# Patient Record
Sex: Female | Born: 1960 | Hispanic: No | Marital: Married | Smoking: Former smoker
Health system: Southern US, Community
[De-identification: ages and names within clinical notes are randomized; demographics above are authoritative.]

## PROBLEM LIST (undated history)

## (undated) DIAGNOSIS — K219 Gastro-esophageal reflux disease without esophagitis: Secondary | ICD-10-CM

## (undated) DIAGNOSIS — K589 Irritable bowel syndrome without diarrhea: Secondary | ICD-10-CM

## (undated) DIAGNOSIS — E559 Vitamin D deficiency, unspecified: Secondary | ICD-10-CM

## (undated) DIAGNOSIS — E785 Hyperlipidemia, unspecified: Secondary | ICD-10-CM

## (undated) DIAGNOSIS — R519 Headache, unspecified: Secondary | ICD-10-CM

## (undated) DIAGNOSIS — M810 Age-related osteoporosis without current pathological fracture: Secondary | ICD-10-CM

## (undated) HISTORY — DX: Vitamin D deficiency, unspecified: E55.9

## (undated) HISTORY — PX: EYE SURGERY: SHX253

## (undated) HISTORY — DX: Hyperlipidemia, unspecified: E78.5

## (undated) HISTORY — DX: Age-related osteoporosis without current pathological fracture: M81.0

## (undated) HISTORY — DX: Irritable bowel syndrome, unspecified: K58.9

---

## 1997-10-27 ENCOUNTER — Emergency Department (HOSPITAL_COMMUNITY): Admission: EM | Admit: 1997-10-27 | Discharge: 1997-10-27 | Payer: Self-pay | Admitting: Emergency Medicine

## 2012-08-23 DIAGNOSIS — M129 Arthropathy, unspecified: Secondary | ICD-10-CM | POA: Insufficient documentation

## 2012-08-23 DIAGNOSIS — E785 Hyperlipidemia, unspecified: Secondary | ICD-10-CM | POA: Insufficient documentation

## 2012-08-23 DIAGNOSIS — IMO0002 Reserved for concepts with insufficient information to code with codable children: Secondary | ICD-10-CM | POA: Insufficient documentation

## 2012-08-23 DIAGNOSIS — K219 Gastro-esophageal reflux disease without esophagitis: Secondary | ICD-10-CM | POA: Insufficient documentation

## 2012-08-23 DIAGNOSIS — F341 Dysthymic disorder: Secondary | ICD-10-CM | POA: Insufficient documentation

## 2012-08-23 DIAGNOSIS — G4700 Insomnia, unspecified: Secondary | ICD-10-CM | POA: Insufficient documentation

## 2014-12-03 DIAGNOSIS — M810 Age-related osteoporosis without current pathological fracture: Secondary | ICD-10-CM | POA: Insufficient documentation

## 2014-12-10 DIAGNOSIS — F411 Generalized anxiety disorder: Secondary | ICD-10-CM | POA: Insufficient documentation

## 2016-08-06 DIAGNOSIS — K5000 Crohn's disease of small intestine without complications: Secondary | ICD-10-CM | POA: Insufficient documentation

## 2018-09-02 DIAGNOSIS — R7303 Prediabetes: Secondary | ICD-10-CM | POA: Insufficient documentation

## 2018-09-02 DIAGNOSIS — E781 Pure hyperglyceridemia: Secondary | ICD-10-CM | POA: Insufficient documentation

## 2019-10-23 DIAGNOSIS — Z1211 Encounter for screening for malignant neoplasm of colon: Secondary | ICD-10-CM | POA: Insufficient documentation

## 2020-04-20 ENCOUNTER — Ambulatory Visit: Payer: Self-pay | Admitting: Medical

## 2020-07-26 ENCOUNTER — Ambulatory Visit (INDEPENDENT_AMBULATORY_CARE_PROVIDER_SITE_OTHER): Payer: 59 | Admitting: Medical

## 2020-07-26 ENCOUNTER — Other Ambulatory Visit: Payer: Self-pay

## 2020-07-26 ENCOUNTER — Encounter: Payer: Self-pay | Admitting: Medical

## 2020-07-26 VITALS — BP 132/82 | HR 77 | Temp 98.0°F | Resp 18 | Ht 61.0 in | Wt 129.8 lb

## 2020-07-26 DIAGNOSIS — R1310 Dysphagia, unspecified: Secondary | ICD-10-CM

## 2020-07-26 DIAGNOSIS — E785 Hyperlipidemia, unspecified: Secondary | ICD-10-CM | POA: Diagnosis not present

## 2020-07-26 DIAGNOSIS — K219 Gastro-esophageal reflux disease without esophagitis: Secondary | ICD-10-CM | POA: Diagnosis not present

## 2020-07-26 DIAGNOSIS — K589 Irritable bowel syndrome without diarrhea: Secondary | ICD-10-CM

## 2020-07-26 DIAGNOSIS — E559 Vitamin D deficiency, unspecified: Secondary | ICD-10-CM

## 2020-07-26 DIAGNOSIS — N941 Unspecified dyspareunia: Secondary | ICD-10-CM

## 2020-07-26 DIAGNOSIS — M810 Age-related osteoporosis without current pathological fracture: Secondary | ICD-10-CM

## 2020-07-26 NOTE — Progress Notes (Signed)
Subjective:    Patient ID: Jacqueline Vasquez, female    DOB: 02/22/1961, 60 y.o.   MRN: [id number]  HPI  Pt in for first time.  Was with [place]. Pt works for [employer]. Pt does exercise 10 minutes on exercise bike. Non smoker, no alcohol use. Pt states moderate healthy diet.   Pt has history cholesterol, gerd, Ibs, low vit d and osteopenia.  See med list.  Last year had labs but none since.  Pt states has some difficlty swollowing solids and drinks for years. Hx of gerd for year. On ppi. Pt had egd and colonocopy n 10-2019.    Review of Systems  Constitutional: Negative for chills, fatigue and fever.  HENT: Negative for congestion and drooling.   Respiratory: Negative for cough, chest tightness, shortness of breath and wheezing.   Cardiovascular: Negative for chest pain and palpitations.  Gastrointestinal: Negative for abdominal pain.  Genitourinary: Negative for difficulty urinating, dysuria, frequency and genital sores.  Musculoskeletal: Negative for back pain and neck pain.  Skin: Negative for rash.  Neurological: Negative for dizziness, numbness and headaches.  Hematological: Negative for adenopathy. Does not bruise/bleed easily.  Psychiatric/Behavioral: Negative for behavioral problems, confusion and dysphoric mood. The patient is not nervous/anxious.     No past medical history on file.   Social History   Socioeconomic History  . Marital status: Married    Spouse name: Not on file  . Number of children: Not on file  . Years of education: Not on file  . Highest education level: Not on file  Occupational History  . Not on file  Tobacco Use  . Smoking status: Not on file  . Smokeless tobacco: Not on file  Substance and Sexual Activity  . Alcohol use: Not on file  . Drug use: Not on file  . Sexual activity: Not on file  Other Topics Concern  . Not on file  Social History Narrative  . Not on file   Social Determinants of Health   Financial  Resource Strain: Not on file  Food Insecurity: Not on file  Transportation Needs: Not on file  Physical Activity: Not on file  Stress: Not on file  Social Connections: Not on file  Intimate Partner Violence: Not on file     No family history on file.  Allergies  Allergen Reactions  . Ibuprofen Nausea And Vomiting and Other (See Comments)    GI upset GI upset     Current Outpatient Medications on File Prior to Visit  Medication Sig Dispense Refill  . Cholecalciferol 50 MCG (2000 UT) TABS Take by mouth.    . dicyclomine (BENTYL) 20 MG tablet Take by mouth.    Marland Kitchen omeprazole (PRILOSEC) 20 MG capsule Take by mouth.    . Ospemifene 60 MG TABS Take by mouth.    . risedronate (ACTONEL) 150 MG tablet TAKE 1 TABLET BY MOUTH EVERY 30 (THIRTY) DAYS.    Marland Kitchen rosuvastatin (CRESTOR) 20 MG tablet Take 1 tablet by mouth daily.     No current facility-administered medications on file prior to visit.    BP 132/82   Pulse 77   Temp 98 F (36.7 C)   Resp 18   Ht 5' 1"  (1.549 m)   Wt 129 lb 12.8 oz (58.9 kg)   SpO2 98%   BMI 24.53 kg/m       Objective:   Physical Exam  General Mental Status- Alert. General Appearance- Not in acute distress.   Skin  General: Color- Normal Color. Moisture- Normal Moisture.  Neck Carotid Arteries- Normal color. Moisture- Normal Moisture. No carotid bruits. No JVD.  Chest and Lung Exam Auscultation: Breath Sounds:-Normal.  Cardiovascular Auscultation:Rythm- Regular. Murmurs & Other Heart Sounds:Auscultation of the heart reveals- No Murmurs.  Abdomen Inspection:-Inspeection Normal. Palpation/Percussion:Note:No mass. Palpation and Percussion of the abdomen reveal- Non Tender, Non Distended + BS, no rebound or guarding.   Neurologic Cranial Nerve exam:- CN III-XII intact(No nystagmus), symmetric smile. Strength:- 5/5 equal and symmetric strength both upper and lower extremities.      Assessment & Plan:  For history of hyperlipidemia we will  get lipid panel and metabolic panel today.  Presently continue Crestor 20 mg and we will see if dose adjustments needed.  For history of osteoporosis, continue Actonel and plan on getting repeat DEXA scan upcoming year.  For history of GERD, continue healthy diet and omeprazole.  For history of IBS continue Bentyl.  For recent painful swallowing went ahead and refer you to GI MD with [facility].  They can give advice on reflux and IBS as well.  For vitamin D deficiency placed order for vitamin D level today.  For history of pain during sex refer to GYN at the med center.  Follow-up date to be determined after lab review.  Mackie Pai, PA-C

## 2020-07-26 NOTE — Patient Instructions (Addendum)
For history of hyperlipidemia we will get lipid panel and metabolic panel today.  Presently continue Crestor 20 mg and we will see if dose adjustments needed.  For history of osteoporosis, continue Actonel and plan on getting repeat DEXA scan upcoming year.  For history of GERD, continue healthy diet and omeprazole.  For history of IBS continue Bentyl.  For recent painful swallowing went ahead and refer you to GI MD with [facility].  They can give advice on reflux and IBS as well.  For vitamin D deficiency placed order for vitamin D level today.  For history of pain during sex refer to GYN at the med center.  Follow-up date to be determined after lab review.

## 2020-07-27 ENCOUNTER — Telehealth: Payer: Self-pay

## 2020-07-27 ENCOUNTER — Telehealth: Payer: Self-pay | Admitting: Medical

## 2020-07-27 DIAGNOSIS — E875 Hyperkalemia: Secondary | ICD-10-CM

## 2020-07-27 LAB — COMPREHENSIVE METABOLIC PANEL
ALT: 42 U/L — ABNORMAL HIGH (ref 0–35)
AST: 37 U/L (ref 0–37)
Albumin: 4.4 g/dL (ref 3.5–5.2)
Alkaline Phosphatase: 64 U/L (ref 39–117)
BUN: 13 mg/dL (ref 6–23)
CO2: 26 mEq/L (ref 19–32)
Calcium: 9.1 mg/dL (ref 8.4–10.5)
Chloride: 103 mEq/L (ref 96–112)
Creatinine, Ser: 0.51 mg/dL (ref 0.40–1.20)
GFR: 100.33 mL/min (ref >60.00)
Glucose, Bld: 110 mg/dL — ABNORMAL HIGH (ref 70–99)
Potassium: 6.6 mEq/L (ref 3.5–5.1)
Sodium: 135 mEq/L (ref 135–145)
Total Bilirubin: 0.3 mg/dL (ref 0.2–1.2)
Total Protein: 7.1 g/dL (ref 6.0–8.3)

## 2020-07-27 LAB — LIPID PANEL
Cholesterol: 138 mg/dL (ref 0–200)
HDL: 43 mg/dL (ref >39.00)
LDL Cholesterol: 64 mg/dL (ref 0–99)
NonHDL: 95.18
Total CHOL/HDL Ratio: 3
Triglycerides: 155 mg/dL — ABNORMAL HIGH (ref 0.0–149.0)
VLDL: 31 mg/dL (ref 0.0–40.0)

## 2020-07-27 MED ORDER — SODIUM POLYSTYRENE SULFONATE 15 GM/60ML PO SUSP: ORAL | 0 refills | Status: AC

## 2020-07-27 NOTE — Telephone Encounter (Signed)
Future CMP placed.

## 2020-07-27 NOTE — Telephone Encounter (Signed)
CRITICAL VALUE STICKER  CRITICAL VALUE: Potassium 6.6  RECEIVER (on-site recipient of call):Malori Myers  DATE & TIME NOTIFIED: 07/27/20 At 12:45  MESSENGER (representative from lab): Saa  MD NOTIFIED: Mackie Pai, PA-C  TIME OF NOTIFICATION:   RESPONSE:

## 2020-07-31 LAB — VITAMIN D 1,25 DIHYDROXY
Vitamin D 1, 25 (OH)2 Total: 60 pg/mL (ref 18–72)
Vitamin D2 1, 25 (OH)2: <8 pg/mL
Vitamin D3 1, 25 (OH)2: 60 pg/mL

## 2020-08-02 ENCOUNTER — Other Ambulatory Visit: Payer: Self-pay

## 2020-08-03 ENCOUNTER — Other Ambulatory Visit: Payer: Self-pay

## 2020-08-03 ENCOUNTER — Other Ambulatory Visit (INDEPENDENT_AMBULATORY_CARE_PROVIDER_SITE_OTHER): Payer: 59

## 2020-08-03 DIAGNOSIS — E875 Hyperkalemia: Secondary | ICD-10-CM

## 2020-08-03 LAB — COMPREHENSIVE METABOLIC PANEL
ALT: 38 U/L — ABNORMAL HIGH (ref 0–35)
AST: 29 U/L (ref 0–37)
Albumin: 4.7 g/dL (ref 3.5–5.2)
Alkaline Phosphatase: 64 U/L (ref 39–117)
BUN: 8 mg/dL (ref 6–23)
CO2: 27 mEq/L (ref 19–32)
Calcium: 9.5 mg/dL (ref 8.4–10.5)
Chloride: 103 mEq/L (ref 96–112)
Creatinine, Ser: 0.5 mg/dL (ref 0.40–1.20)
GFR: 100.8 mL/min (ref >60.00)
Glucose, Bld: 106 mg/dL — ABNORMAL HIGH (ref 70–99)
Potassium: 4.3 mEq/L (ref 3.5–5.1)
Sodium: 139 mEq/L (ref 135–145)
Total Bilirubin: 0.5 mg/dL (ref 0.2–1.2)
Total Protein: 7.4 g/dL (ref 6.0–8.3)

## 2020-09-01 ENCOUNTER — Encounter: Payer: 59 | Admitting: Obstetrics and Gynecology

## 2020-10-13 ENCOUNTER — Encounter: Payer: 59 | Admitting: Obstetrics and Gynecology

## 2020-11-10 ENCOUNTER — Encounter: Payer: 59 | Admitting: Obstetrics and Gynecology

## 2020-12-25 NOTE — Progress Notes (Signed)
GYNECOLOGY OFFICE VISIT NOTE  History:   Jacqueline Vasquez is a 60 y.o.  G1P1 here today for pain with intercourse. This is not a new issue - she has not been able to have intercourse for about 2 years due to the pain. Now she has discomfort at baseline feeling like her vagina is dry.   She has tried nothing yet.  Her husband is very kind she reports so she wants to be able to have intercourse for him. She has low drive but would still be interested if it didn't hurt.   She also notes some leakage of urine with cough.   She denies any abnormal vaginal discharge, bleeding, pelvic pain or other concerns.     Past Medical History:  Diagnosis Date   Hyperlipidemia    IBS (irritable bowel syndrome)    Osteoporosis    Vitamin D deficiency     Past Surgical History:  Procedure Laterality Date   CESAREAN SECTION     EYE SURGERY      The following portions of the patient's history were reviewed and updated as appropriate: allergies, current medications, past family history, past medical history, past social history, past surgical history and problem list.   Health Maintenance:  Normal pap and negative HRHPV on 02/2020.  Normal mammogram on 02/2020.   Review of Systems:  Pertinent items noted in HPI and remainder of comprehensive ROS otherwise negative.  Physical Exam:  BP 124/80   Pulse 70   Resp 16   Ht 5' (1.524 m)   Wt 131 lb (59.4 kg)   BMI 25.58 kg/m  CONSTITUTIONAL: Well-developed, well-nourished female in no acute distress.  HEENT:  Normocephalic, atraumatic. External right and left ear normal. No scleral icterus.  NECK: Normal range of motion, supple, no masses noted on observation SKIN: No rash noted. Not diaphoretic. No erythema. No pallor. MUSCULOSKELETAL: Normal range of motion. No edema noted. NEUROLOGIC: Alert and oriented to person, place, and time. Normal muscle tone coordination. No cranial nerve deficit noted. PSYCHIATRIC: Normal mood and affect. Normal behavior.  Normal judgment and thought content.  CARDIOVASCULAR: Normal heart rate noted RESPIRATORY: Effort and breath sounds normal, no problems with respiration noted ABDOMEN: No masses noted. No other overt distention noted.    PELVIC: Normal appearing external genitalia; normal urethral meatus; normal appearing vaginal mucosa although atrophic and cervix.  No abnormal discharge noted.  Normal uterine size, no other palpable masses, no uterine or adnexal tenderness. Uterus very well supported as well as vaginal tissue Performed in the presence of a chaperone  Labs and Imaging No results found for this or any previous visit (from the past 168 hour(s)). No results found.    Assessment and Plan:    1. Dyspareunia in female - Vaginal dryness most likely due to being postmenopausal - exam is c/w atrophy.  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  - Recommended OTC lubricants i.e. silicone or oil based as well as vaginal moisturizer i.e. Replens - Discussed different formulations of vaginal estrogen. Reviewed the risks and package insert for vaginal estrogen.  - She would like to try vaginal estrogen.  - Reviewed therapy course with her and spouse, by phone   Routine preventative health maintenance measures emphasized. Please refer to After Visit Summary for other counseling recommendations.   No follow-ups on file.    I spent 30 minutes dedicated to the care of this patient including pre-visit review of records, face to face time with the patient discussing  her conditions and treatments and post visit orders.    Radene Gunning, MD, [facility], [place]

## 2020-12-29 ENCOUNTER — Encounter: Payer: Self-pay | Admitting: Obstetrics and Gynecology

## 2020-12-29 ENCOUNTER — Other Ambulatory Visit: Payer: Self-pay

## 2020-12-29 ENCOUNTER — Ambulatory Visit (INDEPENDENT_AMBULATORY_CARE_PROVIDER_SITE_OTHER): Payer: 59 | Admitting: Obstetrics and Gynecology

## 2020-12-29 VITALS — BP 124/80 | HR 70 | Resp 16 | Ht 60.0 in | Wt 131.0 lb

## 2020-12-29 DIAGNOSIS — N941 Unspecified dyspareunia: Secondary | ICD-10-CM

## 2020-12-29 MED ORDER — ESTRADIOL 0.1 MG/GM VA CREA: TOPICAL_CREAM | VAGINAL | 12 refills | Status: DC

## 2021-01-02 ENCOUNTER — Other Ambulatory Visit: Payer: Self-pay

## 2021-01-02 DIAGNOSIS — N941 Unspecified dyspareunia: Secondary | ICD-10-CM

## 2021-01-02 MED ORDER — ESTRADIOL 0.1 MG/GM VA CREA: TOPICAL_CREAM | VAGINAL | 12 refills | Status: DC

## 2021-01-02 MED ORDER — ESTRADIOL 0.1 MG/GM VA CREA: TOPICAL_CREAM | VAGINAL | 12 refills | Status: AC

## 2021-01-02 NOTE — Addendum Note (Signed)
Addended by: Radene Gunning A on: 01/02/2021 05:35 PM   Modules accepted: Orders

## 2021-02-21 ENCOUNTER — Encounter: Payer: 59 | Admitting: Medical

## 2021-03-10 ENCOUNTER — Ambulatory Visit (INDEPENDENT_AMBULATORY_CARE_PROVIDER_SITE_OTHER): Payer: 59 | Admitting: Medical

## 2021-03-10 VITALS — BP 127/81 | HR 83 | Temp 98.2°F | Resp 18 | Ht 60.0 in | Wt 133.6 lb

## 2021-03-10 DIAGNOSIS — Z0000 Encounter for general adult medical examination without abnormal findings: Secondary | ICD-10-CM | POA: Diagnosis not present

## 2021-03-10 DIAGNOSIS — E559 Vitamin D deficiency, unspecified: Secondary | ICD-10-CM | POA: Diagnosis not present

## 2021-03-10 DIAGNOSIS — E785 Hyperlipidemia, unspecified: Secondary | ICD-10-CM | POA: Diagnosis not present

## 2021-03-10 DIAGNOSIS — Z23 Encounter for immunization: Secondary | ICD-10-CM | POA: Diagnosis not present

## 2021-03-10 DIAGNOSIS — M810 Age-related osteoporosis without current pathological fracture: Secondary | ICD-10-CM | POA: Diagnosis not present

## 2021-03-10 DIAGNOSIS — K219 Gastro-esophageal reflux disease without esophagitis: Secondary | ICD-10-CM | POA: Diagnosis not present

## 2021-03-10 DIAGNOSIS — K589 Irritable bowel syndrome without diarrhea: Secondary | ICD-10-CM

## 2021-03-10 LAB — CBC WITH DIFFERENTIAL/PLATELET
Basophils Absolute: 0 10*3/uL (ref 0.0–0.1)
Basophils Relative: 0.7 % (ref 0.0–3.0)
Eosinophils Absolute: 0.2 10*3/uL (ref 0.0–0.7)
Eosinophils Relative: 2.6 % (ref 0.0–5.0)
HCT: 38 % (ref 36.0–46.0)
Hemoglobin: 12.7 g/dL (ref 12.0–15.0)
Lymphocytes Relative: 41.1 % (ref 12.0–46.0)
Lymphs Abs: 2.8 10*3/uL (ref 0.7–4.0)
MCHC: 33.5 g/dL (ref 30.0–36.0)
MCV: 91.6 fl (ref 78.0–100.0)
Monocytes Absolute: 0.3 10*3/uL (ref 0.1–1.0)
Monocytes Relative: 4.5 % (ref 3.0–12.0)
Neutro Abs: 3.5 10*3/uL (ref 1.4–7.7)
Neutrophils Relative %: 51.1 % (ref 43.0–77.0)
Platelets: 231 10*3/uL (ref 150.0–400.0)
RBC: 4.15 Mil/uL (ref 3.87–5.11)
RDW: 12.5 % (ref 11.5–15.5)
WBC: 6.8 10*3/uL (ref 4.0–10.5)

## 2021-03-10 LAB — LIPID PANEL
Cholesterol: 216 mg/dL — ABNORMAL HIGH (ref 0–200)
HDL: 46.3 mg/dL (ref >39.00)
LDL Cholesterol: 138 mg/dL — ABNORMAL HIGH (ref 0–99)
NonHDL: 169.71
Total CHOL/HDL Ratio: 5
Triglycerides: 159 mg/dL — ABNORMAL HIGH (ref 0.0–149.0)
VLDL: 31.8 mg/dL (ref 0.0–40.0)

## 2021-03-10 LAB — COMPREHENSIVE METABOLIC PANEL
ALT: 35 U/L (ref 0–35)
AST: 34 U/L (ref 0–37)
Albumin: 4.5 g/dL (ref 3.5–5.2)
Alkaline Phosphatase: 57 U/L (ref 39–117)
BUN: 10 mg/dL (ref 6–23)
CO2: 28 mEq/L (ref 19–32)
Calcium: 9.3 mg/dL (ref 8.4–10.5)
Chloride: 102 mEq/L (ref 96–112)
Creatinine, Ser: 0.52 mg/dL (ref 0.40–1.20)
GFR: 99.43 mL/min (ref >60.00)
Glucose, Bld: 104 mg/dL — ABNORMAL HIGH (ref 70–99)
Potassium: 3.9 mEq/L (ref 3.5–5.1)
Sodium: 138 mEq/L (ref 135–145)
Total Bilirubin: 0.6 mg/dL (ref 0.2–1.2)
Total Protein: 7.3 g/dL (ref 6.0–8.3)

## 2021-03-10 MED ORDER — DICYCLOMINE HCL 20 MG PO TABS: ORAL_TABLET | ORAL | 3 refills | Status: DC

## 2021-03-10 MED ORDER — OMEPRAZOLE 20 MG PO CPDR: 20.0000 mg | DELAYED_RELEASE_CAPSULE | Freq: Every day | ORAL | 3 refills | Status: DC

## 2021-03-10 MED ORDER — ROSUVASTATIN CALCIUM 20 MG PO TABS: 20.0000 mg | ORAL_TABLET | Freq: Every day | ORAL | 3 refills | Status: DC

## 2021-03-10 NOTE — Patient Instructions (Addendum)
For you wellness exam today I have ordered cbc, cmp and  lipid panel.  Flu vaccine today. Ask you insurance if shingrix vaccine covered.  Recommend exercise and healthy diet.  We will let you know lab results as they come in.  Follow up date appointment will be determined after lab review.     For gerd- controlled with omeprazole. Refilled rx today.  Ibs- refilled bentyl today.  For osteoporosis- continue actonel monthy.  Hyperlipidemia- refilled crestor. Will take into account that you have been off of med when lipid panel reviewed.   Preventive Care 69-64 Years Old, Female Preventive care refers to lifestyle choices and visits with your health care provider that can promote health and wellness. Preventive care visits are also called wellness exams. What can I expect for my preventive care visit? Counseling Your health care provider may ask you questions about your: Medical history, including: Past medical problems. Family medical history. Pregnancy history. Current health, including: Menstrual cycle. Method of birth control. Emotional well-being. Home life and relationship well-being. Sexual activity and sexual health. Lifestyle, including: Alcohol, nicotine or tobacco, and drug use. Access to firearms. Diet, exercise, and sleep habits. Work and work Statistician. Sunscreen use. Safety issues such as seatbelt and bike helmet use. Physical exam Your health care provider will check your: Height and weight. These may be used to calculate your BMI (body mass index). BMI is a measurement that tells if you are at a healthy weight. Waist circumference. This measures the distance around your waistline. This measurement also tells if you are at a healthy weight and may help predict your risk of certain diseases, such as type 2 diabetes and high blood pressure. Heart rate and blood pressure. Body temperature. Skin for abnormal spots. What immunizations do I need? Vaccines are  usually given at various ages, according to a schedule. Your health care provider will recommend vaccines for you based on your age, medical history, and lifestyle or other factors, such as travel or where you work. What tests do I need? Screening Your health care provider may recommend screening tests for certain conditions. This may include: Lipid and cholesterol levels. Diabetes screening. This is done by checking your blood sugar (glucose) after you have not eaten for a while (fasting). Pelvic exam and Pap test. Hepatitis B test. Hepatitis C test. HIV (human immunodeficiency virus) test. STI (sexually transmitted infection) testing, if you are at risk. Lung cancer screening. Colorectal cancer screening. Mammogram. Talk with your health care provider about when you should start having regular mammograms. This may depend on whether you have a family history of breast cancer. BRCA-related cancer screening. This may be done if you have a family history of breast, ovarian, tubal, or peritoneal cancers. Bone density scan. This is done to screen for osteoporosis. Talk with your health care provider about your test results, treatment options, and if necessary, the need for more tests. Follow these instructions at home: Eating and drinking  Eat a diet that includes fresh fruits and vegetables, whole grains, lean protein, and low-fat dairy products. Take vitamin and mineral supplements as recommended by your health care provider. Do not drink alcohol if: Your health care provider tells you not to drink. You are pregnant, may be pregnant, or are planning to become pregnant. If you drink alcohol: Limit how much you have to 0-1 drink a day. Know how much alcohol is in your drink. In the [place] one drink equals one 12 oz bottle of beer (355 mL), one 5  oz glass of wine (148 mL), or one 1 oz glass of hard liquor (44 mL). Lifestyle Brush your teeth every morning and night with fluoride toothpaste.  Floss one time each day. Exercise for at least 30 minutes 5 or more days each week. Do not use any products that contain nicotine or tobacco. These products include cigarettes, chewing tobacco, and vaping devices, such as e-cigarettes. If you need help quitting, ask your health care provider. Do not use drugs. If you are sexually active, practice safe sex. Use a condom or other form of protection to prevent STIs. If you do not wish to become pregnant, use a form of birth control. If you plan to become pregnant, see your health care provider for a prepregnancy visit. Take aspirin only as told by your health care provider. Make sure that you understand how much to take and what form to take. Work with your health care provider to find out whether it is safe and beneficial for you to take aspirin daily. Find healthy ways to manage stress, such as: Meditation, yoga, or listening to music. Journaling. Talking to a trusted person. Spending time with friends and family. Minimize exposure to UV radiation to reduce your risk of skin cancer. Safety Always wear your seat belt while driving or riding in a vehicle. Do not drive: If you have been drinking alcohol. Do not ride with someone who has been drinking. When you are tired or distracted. While texting. If you have been using any mind-altering substances or drugs. Wear a helmet and other protective equipment during sports activities. If you have firearms in your house, make sure you follow all gun safety procedures. Seek help if you have been physically or sexually abused. What's next? Visit your health care provider once a year for an annual wellness visit. Ask your health care provider how often you should have your eyes and teeth checked. Stay up to date on all vaccines. This information is not intended to replace advice given to you by your health care provider. Make sure you discuss any questions you have with your health care  provider. Document Revised: 08/31/2020 Document Reviewed: 08/31/2020 Elsevier Patient Education  [place].

## 2021-03-10 NOTE — Progress Notes (Signed)
Subjective:    Patient ID: Jacqueline Vasquez, female    DOB: August 19, 1960, 60 y.o.   MRN: [id number]  HPI  Pt in for wellness exam.  Works at [employer]. Pt exercises doing elliptical very rarely recently. Plans to work out more. She is eating overall healthy. Nonsmoker. No alchohol.   Last papsmear-03/15/2020  Will have pt ask her insurance if covers shingrex.  Jerrye Bushy- controlled with diet and omeprazole.  For osteoporosis on actonel.  Hyperlipdemia- on crestor in past. Back in May she ran out. I don't remember getting refill request?? Request may have been sent to [facility] pcp.  IBS- controlled with bentyl.    Review of Systems  Constitutional:  Negative for chills, fatigue and fever.  HENT:  Negative for congestion, ear pain, facial swelling, sinus pressure and sinus pain.   Respiratory:  Negative for cough, chest tightness, shortness of breath and wheezing.   Cardiovascular:  Negative for chest pain and palpitations.  Gastrointestinal:  Negative for abdominal pain, blood in stool, diarrhea, nausea and vomiting.  Genitourinary:  Negative for dysuria and frequency.  Musculoskeletal:  Negative for back pain.  Skin:  Negative for rash.  Neurological:  Negative for dizziness, numbness and headaches.  Hematological:  Negative for adenopathy. Does not bruise/bleed easily.  Psychiatric/Behavioral:  Negative for behavioral problems and confusion.     Past Medical History:  Diagnosis Date   Hyperlipidemia    IBS (irritable bowel syndrome)    Osteoporosis    Vitamin D deficiency      Social History   Socioeconomic History   Marital status: Married    Spouse name: Not on file   Number of children: Not on file   Years of education: Not on file   Highest education level: Not on file  Occupational History   Not on file  Tobacco Use   Smoking status: Former    Packs/day: 0.50    Years: 28.00    Pack years: 14.00    Types: Cigarettes    Quit date: 11/17/2010    Years since  quitting: 10.3   Smokeless tobacco: Never  Vaping Use   Vaping Use: Never used  Substance and Sexual Activity   Alcohol use: Never   Drug use: Never   Sexual activity: Not Currently    Birth control/protection: None  Other Topics Concern   Not on file  Social History Narrative   ** Merged History Encounter **       Social Determinants of Health   Financial Resource Strain: Not on file  Food Insecurity: Not on file  Transportation Needs: Not on file  Physical Activity: Not on file  Stress: Not on file  Social Connections: Not on file  Intimate Partner Violence: Not on file    Past Surgical History:  Procedure Laterality Date   CESAREAN SECTION     EYE SURGERY      No family history on file.  Allergies  Allergen Reactions   Ibuprofen Nausea And Vomiting and Other (See Comments)    GI upset GI upset     Current Outpatient Medications on File Prior to Visit  Medication Sig Dispense Refill   Cholecalciferol 50 MCG (2000 UT) TABS Take by mouth.     dicyclomine (BENTYL) 20 MG tablet Take by mouth.     estradiol (ESTRACE) 0.1 MG/GM vaginal cream Apply 1 gram per vagina every night for 2 weeks, then apply two to three times a week 30 g 12   estradiol (ESTRACE) 0.1  MG/GM vaginal cream Apply 1 gram per vagina every night for 2 weeks, then apply three times a week 30 g 12   omeprazole (PRILOSEC) 20 MG capsule Take by mouth.     Ospemifene 60 MG TABS Take by mouth.     risedronate (ACTONEL) 150 MG tablet TAKE 1 TABLET BY MOUTH EVERY 30 (THIRTY) DAYS.     rosuvastatin (CRESTOR) 20 MG tablet Take 1 tablet by mouth daily.     sodium polystyrene (KAYEXALATE) 15 GM/60ML suspension 60 ml po for 4 days. 240 mL 0   No current facility-administered medications on file prior to visit.    BP 127/81    Pulse 83    Temp 98.2 F (36.8 C)    Resp 18    Ht 5' (1.524 m)    Wt 133 lb 9.6 oz (60.6 kg)    SpO2 96%    BMI 26.09 kg/m       Objective:   Physical Exam  General Mental  Status- Alert. General Appearance- Not in acute distress.   Skin General: Color- Normal Color. Moisture- Normal Moisture.  Neck Carotid Arteries- Normal color. Moisture- Normal Moisture. No carotid bruits. No JVD.  Chest and Lung Exam Auscultation: Breath Sounds:-Normal.  Cardiovascular Auscultation:Rythm- Regular. Murmurs & Other Heart Sounds:Auscultation of the heart reveals- No Murmurs.  Abdomen Inspection:-Inspeection Normal. Palpation/Percussion:Note:No mass. Palpation and Percussion of the abdomen reveal- Non Tender, Non Distended + BS, no rebound or guarding.   Neurologic Cranial Nerve exam:- CN III-XII intact(No nystagmus), symmetric smile. Strength:- 5/5 equal and symmetric strength both upper and lower extremities.       Assessment & Plan:   Patient Instructions  For you wellness exam today I have ordered cbc, cmp and  lipid panel.  Flu vaccine today. Ask you insurance if shingrix vaccine covered.  Recommend exercise and healthy diet.  We will let you know lab results as they come in.  Follow up date appointment will be determined after lab review.     For gerd- controlled with omeprazole. Refilled rx today.  Ibs- refilled bentyl today.  For osteoporosis- continue actonel monthy.  Hyperlipidemia- refilled crestor. Will take into account that you have been off of med when lipid panel reviewed.    Mackie Pai, [place]   [id number] charge as addressed chronic dx hyperlipidemia, gerd, ibs, vit d def and osteoporosis.

## 2021-03-14 LAB — VITAMIN D 1,25 DIHYDROXY
Vitamin D 1, 25 (OH)2 Total: 50 pg/mL (ref 18–72)
Vitamin D2 1, 25 (OH)2: <8 pg/mL
Vitamin D3 1, 25 (OH)2: 50 pg/mL

## 2021-06-15 ENCOUNTER — Encounter: Payer: Self-pay | Admitting: Medical

## 2021-06-22 ENCOUNTER — Ambulatory Visit: Payer: 59 | Admitting: Medical

## 2021-07-24 ENCOUNTER — Other Ambulatory Visit: Payer: Self-pay | Admitting: Medical

## 2021-07-24 ENCOUNTER — Ambulatory Visit (HOSPITAL_BASED_OUTPATIENT_CLINIC_OR_DEPARTMENT_OTHER)
Admission: RE | Admit: 2021-07-24 | Discharge: 2021-07-24 | Disposition: A | Discharge status: Home / Self Care | Payer: 59 | Source: Ambulatory Visit | Attending: Medical | Admitting: Medical

## 2021-07-24 ENCOUNTER — Encounter: Payer: Self-pay | Admitting: Medical

## 2021-07-24 ENCOUNTER — Ambulatory Visit: Payer: 59 | Admitting: Medical

## 2021-07-24 VITALS — BP 110/70 | HR 88 | Temp 98.5°F | Resp 18 | Ht 64.0 in | Wt 129.6 lb

## 2021-07-24 DIAGNOSIS — R519 Headache, unspecified: Secondary | ICD-10-CM | POA: Diagnosis present

## 2021-07-24 DIAGNOSIS — I6381 Other cerebral infarction due to occlusion or stenosis of small artery: Secondary | ICD-10-CM

## 2021-07-24 DIAGNOSIS — M26629 Arthralgia of temporomandibular joint, unspecified side: Secondary | ICD-10-CM | POA: Diagnosis not present

## 2021-07-24 LAB — SEDIMENTATION RATE: Sed Rate: 14 mm/hr (ref 0–30)

## 2021-07-24 MED ORDER — CYCLOBENZAPRINE HCL 5 MG PO TABS: 5.0000 mg | ORAL_TABLET | Freq: Every day | ORAL | 0 refills | Status: AC

## 2021-07-24 NOTE — Progress Notes (Signed)
? ?Subjective:  ? ? Patient ID: Jacqueline Vasquez, female    DOB: [birth year], 61 y.o.   MRN: [id number] ? ?HPI ? ?Pt in for some recent pain on left side of her head for 3-4 . She states pain feel like being hit in head. Other time feel like need in side of her head.  No rash on side of her face or in her hair. No trauma or fall.  ? ?Pain is on and off. Pain level 7/10 at times. Pain levels varies. ?No nausea or vomiting. States on discussion sometimes pain feels like hammer other times like a needle. ? ?Sometimes pain in temporal area. She also notes pain in left tmj area. ? ?She states tmj area on left side sometimes popetimes pops when opend mouth or eats past 2 months. ? ? ? ?Review of Systems  ?Constitutional:  Negative for chills, fatigue and fever.  ?Respiratory:  Negative for cough, chest tightness, shortness of breath and wheezing.   ?Cardiovascular:  Negative for chest pain and palpitations.  ?Gastrointestinal:  Negative for abdominal pain, blood in stool and diarrhea.  ?Genitourinary:  Negative for dysuria.  ?Musculoskeletal:  Negative for back pain and joint swelling.  ?     Temporal area pain and left TMJ area pain.  ?Neurological:  Positive for headaches. Negative for dizziness, weakness and numbness.  ?     Ha persistent intermittent ha.  ?Hematological:  Negative for adenopathy. Does not bruise/bleed easily.  ?Psychiatric/Behavioral:  Negative for behavioral problems, confusion and dysphoric mood.   ? ? ?Past Medical History:  ?Diagnosis Date  ? Hyperlipidemia   ? IBS (irritable bowel syndrome)   ? Osteoporosis   ? Vitamin D deficiency   ? ?  ?Social History  ? ?Socioeconomic History  ? Marital status: Married  ?  Spouse name: Not on file  ? Number of children: Not on file  ? Years of education: Not on file  ? Highest education level: Not on file  ?Occupational History  ? Not on file  ?Tobacco Use  ? Smoking status: Former  ?  Packs/day: 0.50  ?  Years: 28.00  ?  Pack years: 14.00  ?  Types: Cigarettes  ?   Quit date: 11/17/2010  ?  Years since quitting: 10.6  ? Smokeless tobacco: Never  ?Vaping Use  ? Vaping Use: Never used  ?Substance and Sexual Activity  ? Alcohol use: Never  ? Drug use: Never  ? Sexual activity: Not Currently  ?  Birth control/protection: None  ?Other Topics Concern  ? Not on file  ?Social History Narrative  ? ** Merged History Encounter **  ?    ? ?Social Determinants of Health  ? ?Financial Resource Strain: Not on file  ?Food Insecurity: Not on file  ?Transportation Needs: Not on file  ?Physical Activity: Not on file  ?Stress: Not on file  ?Social Connections: Not on file  ?Intimate Partner Violence: Not on file  ? ? ?Past Surgical History:  ?Procedure Laterality Date  ? CESAREAN SECTION    ? EYE SURGERY    ? ? ?No family history on file. ? ?Allergies  ?Allergen Reactions  ? Ibuprofen Nausea And Vomiting and Other (See Comments)  ?  GI upset ?GI upset ?  ? ? ?Current Outpatient Medications on File Prior to Visit  ?Medication Sig Dispense Refill  ? Cholecalciferol 50 MCG (2000 UT) TABS Take by mouth.    ? dicyclomine (BENTYL) 20 MG tablet 1-2 tab daily. 180 tablet  3  ? estradiol (ESTRACE) 0.1 MG/GM vaginal cream Apply 1 gram per vagina every night for 2 weeks, then apply two to three times a week 30 g 12  ? estradiol (ESTRACE) 0.1 MG/GM vaginal cream Apply 1 gram per vagina every night for 2 weeks, then apply three times a week 30 g 12  ? omeprazole (PRILOSEC) 20 MG capsule Take 1 capsule (20 mg total) by mouth daily. 90 capsule 3  ? Ospemifene 60 MG TABS Take by mouth.    ? risedronate (ACTONEL) 150 MG tablet TAKE 1 TABLET BY MOUTH EVERY 30 (THIRTY) DAYS.    ? rosuvastatin (CRESTOR) 20 MG tablet Take 1 tablet (20 mg total) by mouth daily. 90 tablet 3  ? sodium polystyrene (KAYEXALATE) 15 GM/60ML suspension 60 ml po for 4 days. 240 mL 0  ? ?No current facility-administered medications on file prior to visit.  ? ? ?BP 110/70   Pulse 88   Temp 98.5 ?F (36.9 ?C)   Resp 18   Ht 5' 4"  (1.626 m)    Wt 129 lb 9.6 oz (58.8 kg)   SpO2 96%   BMI 22.25 kg/m?  ?  ?   ?Objective:  ? Physical Exam ? ? ?General ?Mental Status- Alert. General Appearance- Not in acute distress.  ? ?Skin ?On inspection of scalp left-sided head there is no lesions, vesicles or scarring. ? ?Neck ?Carotid Arteries- Normal color. Moisture- Normal Moisture. No carotid bruits. No JVD. ? ?Chest and Lung Exam ?Auscultation: ?Breath Sounds:-Normal. ? ?Cardiovascular ?Auscultation:Rythm- Regular. ?Murmurs & Other Heart Sounds:Auscultation of the heart reveals- No Murmurs. ? ?Abdomen ?Inspection:-Inspeection Normal. ?Palpation/Percussion:Note:No mass. Palpation and Percussion of the abdomen reveal- Non Tender, Non Distended + BS, no rebound or guarding. ? ? ?Neurologic ?Cranial Nerve exam:- CN III-XII intact(No nystagmus), symmetric smile. ?Drift Test:- No drift. ?Romberg Exam:- Negative.  ?Finger to Nose:- Normal/Intact ?Strength:- 5/5 equal and symmetric strength both upper and lower extremities.  ? ? ?HEENT-left canal is clear and tympanic membrane is normal.  On palpation of left TMJ area on opening and closing mouth no obvious crepitus though she does report popping at times.  Left temporal area I do not see any dilated vessels.  No obvious tenderness to palpation presently. ?   ?Assessment & Plan:  ? ?Patient Instructions  ?Recent months intermittent recurrent headache at least 2-3 times per week with some associated dizziness.  The pain at times in the left parietal region.  Features described as pain that comes and goes feeling like head on the side of the head with a hammer and at times feels like needles poking area.  Entertain the idea of shingles eruption but no evidence of that presently and no evidence of scarring on the skin.  Based on duration of intermittent headache and described new onset decided go ahead and get CT of head without contrast.  This will need to be prior authorized.  If pending study of worsening or changing signs  symptoms to be seen in the emergency department. ? ?Also some features of left TMJ area pain and temporal area pain.  Will get sed rate today and x-rays of left TMJ joint 2. ? ?Presently can use ibuprofen over-the-counter 200 to 400 mg every 8 hours.  Also will make low-dose Flexeril available 5 mg to use 1 tablet at night. ? ?Follow-up in 2 weeks or sooner if needed.  ? ?Mackie Pai, PA-C  ?

## 2021-07-24 NOTE — Patient Instructions (Signed)
Recent months intermittent recurrent headache at least 2-3 times per week with some associated dizziness.  The pain at times in the left parietal region.  Features described as pain that comes and goes feeling like head on the side of the head with a hammer and at times feels like needles poking area.  Entertain the idea of shingles eruption but no evidence of that presently and no evidence of scarring on the skin.  Based on duration of intermittent headache and described new onset decided go ahead and get CT of head without contrast.  This will need to be prior authorized.  If pending study of worsening or changing signs symptoms to be seen in the emergency department. ? ?Also some features of left TMJ area pain and temporal area pain.  Will get sed rate today and x-rays of left TMJ joint 2. ? ?Presently can use ibuprofen over-the-counter 200 to 400 mg every 8 hours.  Also will make low-dose Flexeril available 5 mg to use 1 tablet at night. ? ?Follow-up in 2 weeks or sooner if needed. ?

## 2021-07-26 ENCOUNTER — Ambulatory Visit (HOSPITAL_BASED_OUTPATIENT_CLINIC_OR_DEPARTMENT_OTHER)
Admission: RE | Admit: 2021-07-26 | Discharge: 2021-07-26 | Disposition: A | Discharge status: Home / Self Care | Payer: 59 | Source: Ambulatory Visit | Attending: Medical | Admitting: Medical

## 2021-07-26 DIAGNOSIS — R519 Headache, unspecified: Secondary | ICD-10-CM | POA: Insufficient documentation

## 2021-07-27 NOTE — Addendum Note (Signed)
Addended by: Anabel Halon on: 07/27/2021 09:24 AM ? ? Modules accepted: Orders ? ?

## 2021-08-22 ENCOUNTER — Encounter: Payer: Self-pay | Admitting: Neurology

## 2021-08-22 ENCOUNTER — Ambulatory Visit: Payer: 59 | Admitting: Neurology

## 2021-08-22 VITALS — BP 131/85 | HR 83 | Ht 60.0 in | Wt 132.0 lb

## 2021-08-22 DIAGNOSIS — I6381 Other cerebral infarction due to occlusion or stenosis of small artery: Secondary | ICD-10-CM

## 2021-08-22 DIAGNOSIS — G4733 Obstructive sleep apnea (adult) (pediatric): Secondary | ICD-10-CM

## 2021-08-22 DIAGNOSIS — G43709 Chronic migraine without aura, not intractable, without status migrainosus: Secondary | ICD-10-CM

## 2021-08-22 MED ORDER — BUTALBITAL-APAP-CAFFEINE 50-325-40 MG PO TABS: 1.0000 | ORAL_TABLET | Freq: Four times a day (QID) | ORAL | 3 refills | Status: DC | PRN

## 2021-08-22 NOTE — Progress Notes (Signed)
Chief Complaint  Patient presents with   New Patient (Initial Visit)    Rm 17. Accompanied by husband, Lennette Bihari. NP internal referral for Headaches, basal ganglia infarction.      ASSESSMENT AND PLAN  Jacqueline Vasquez is a 61 y.o. 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   MEDICAL HISTORY:  Jacqueline Vasquez is a 61 year old female, seen in request by her PCP PA Saguier, Percell Miller for evaluation of headaches, initial evaluation was on June 6th 2023.  I reviewed and summarized the referring note. PMHX. HLD Osteoporosis Cataract surgery.  She has history of headaches, but only happen occasionally, last for few hours, NSAIDs will help her headaches. She complains of more frequent headaches, especially since May 2023, left side moderate headaches, with light noise sensitivity, dizziness, floating in her vision, dizziness, unbalanced, lasting for few hours, she would afraid of driving during episode. He also occasionally sharp transient ice piercing headaches  I personally reviewed CT head on May 10th 2023, no acute abnormality, mild small vessel disease, chronic let basal ganglia infarction.  She also complains of loud snoring, frequent awaking, gasping for air, excessive day time sleepiness and fatigue.  PHYSICAL EXAM:   Vitals:   08/22/21 1420  BP: 131/85  Pulse: 83  Weight: 132 lb (59.9 kg)  Height: 5' (1.524 m)     Body mass index is 25.78 kg/m.  PHYSICAL EXAMNIATION:  Gen: NAD, conversant, well nourised, well  groomed                     Cardiovascular: Regular rate rhythm, no peripheral edema, warm, nontender. Eyes: Conjunctivae clear without exudates or hemorrhage Neck: Supple, no carotid bruits. Pulmonary: Clear to auscultation bilaterally   NEUROLOGICAL EXAM:  MENTAL STATUS: Speech/cognition: Awake, alert, oriented to history taking and casual conversation CRANIAL NERVES: CN II: Visual fields are full to confrontation. Pupils are round equal and briskly reactive to light. CN III, IV, VI: extraocular movement are normal. No ptosis. CN V: Facial sensation is intact to light touch CN VII: Face is symmetric with normal eye closure  CN VIII: Hearing is normal to causal conversation. CN IX, X: Phonation is normal. CN XI: Head turning and shoulder shrug are intact CN XII: Narrow pharyngeal space  MOTOR: There is no pronator drift of out-stretched arms. Muscle bulk and tone are normal. Muscle strength is normal.  REFLEXES: Reflexes are 2+ and symmetric at the biceps, triceps, knees, and ankles. Plantar responses are flexor.  SENSORY: Intact to light touch, pinprick and vibratory sensation are intact in fingers and toes.  COORDINATION: There is no trunk or limb dysmetria noted.  GAIT/STANCE: Posture is normal. Gait is steady with normal steps, base, arm swing, and turning. Heel and toe walking are normal. Tandem gait is normal.  Romberg is absent.  REVIEW OF SYSTEMS:  Full 14 system review of systems performed and notable only for as above All other review of systems were negative.   ALLERGIES: Allergies  Allergen Reactions   Ibuprofen Nausea And Vomiting and Other (See  Comments)    GI upset GI upset     HOME MEDICATIONS: Current Outpatient Medications  Medication Sig Dispense Refill   ASPIRIN 81 PO Take 1 tablet by mouth daily.     Cholecalciferol 50 MCG (2000 UT) TABS Take by mouth.     cyclobenzaprine (FLEXERIL) 5 MG tablet Take 1 tablet (5 mg total) by mouth at  bedtime. 5 tablet 0   dicyclomine (BENTYL) 20 MG tablet 1-2 tab daily. 180 tablet 3   estradiol (ESTRACE) 0.1 MG/GM vaginal cream Apply 1 gram per vagina every night for 2 weeks, then apply two to three times a week 30 g 12   estradiol (ESTRACE) 0.1 MG/GM vaginal cream Apply 1 gram per vagina every night for 2 weeks, then apply three times a week 30 g 12   omeprazole (PRILOSEC) 20 MG capsule Take 1 capsule (20 mg total) by mouth daily. 90 capsule 3   Ospemifene 60 MG TABS Take by mouth.     risedronate (ACTONEL) 150 MG tablet TAKE 1 TABLET BY MOUTH EVERY 30 (THIRTY) DAYS.     rosuvastatin (CRESTOR) 20 MG tablet Take 1 tablet (20 mg total) by mouth daily. 90 tablet 3   sodium polystyrene (KAYEXALATE) 15 GM/60ML suspension 60 ml po for 4 days. 240 mL 0   No current facility-administered medications for this visit.    PAST MEDICAL HISTORY: Past Medical History:  Diagnosis Date   Hyperlipidemia    IBS (irritable bowel syndrome)    Osteoporosis    Vitamin D deficiency     PAST SURGICAL HISTORY: Past Surgical History:  Procedure Laterality Date   CESAREAN SECTION     EYE SURGERY      FAMILY HISTORY: History reviewed. No pertinent family history.  SOCIAL HISTORY: Social History   Socioeconomic History   Marital status: Married    Spouse name: Not on file   Number of children: Not on file   Years of education: Not on file   Highest education level: Not on file  Occupational History   Not on file  Tobacco Use   Smoking status: Former    Packs/day: 0.50    Years: 28.00    Pack years: 14.00    Types: Cigarettes    Quit date: 11/17/2010    Years since quitting: 10.7   Smokeless tobacco: Never  Vaping Use   Vaping Use: Never used  Substance and Sexual Activity   Alcohol use: Never   Drug use: Never   Sexual activity: Not Currently    Birth control/protection: None  Other Topics Concern   Not on file  Social History Narrative   ** Merged History Encounter **        Social Determinants of Health   Financial Resource Strain: Not on file  Food Insecurity: Not on file  Transportation Needs: Not on file  Physical Activity: Not on file  Stress: Not on file  Social Connections: Not on file  Intimate Partner Violence: Not on file      Marcial Pacas, M.D. Ph.D.  [facility] [address] [place], [place] [address] Ph: [phone] Fax: [phone]  CC:  Elise Benne [address] [place],  [place] [address]  Saguier, Percell Miller, PA-C

## 2021-08-23 ENCOUNTER — Telehealth: Payer: Self-pay | Admitting: Neurology

## 2021-08-23 LAB — C-REACTIVE PROTEIN: CRP: 1 mg/L (ref 0–10)

## 2021-08-23 LAB — HEMOGLOBIN A1C
Est. average glucose Bld gHb Est-mCnc: 143 mg/dL
Hgb A1c MFr Bld: 6.6 % — ABNORMAL HIGH (ref 4.8–5.6)

## 2021-08-23 LAB — ANA W/REFLEX IF POSITIVE: Anti Nuclear Antibody (ANA): NEGATIVE

## 2021-08-23 LAB — RPR: RPR Ser Ql: NONREACTIVE

## 2021-08-23 LAB — SEDIMENTATION RATE: Sed Rate: 8 mm/hr (ref 0–40)

## 2021-08-23 LAB — TSH: TSH: 1.06 u[IU]/mL (ref 0.450–4.500)

## 2021-08-23 NOTE — Telephone Encounter (Signed)
LMV for patient to call back to schedule [facility] auth: [id number] exp. 08/23/21-10/07/21

## 2021-08-28 NOTE — Telephone Encounter (Signed)
45 mins MR brain w/wo contrast Dr. Krista Blue [facility] Josem Kaufmann: [id number] exp. 08/23/21-10/07/21 scheduled at [facility] 09/05/21 at 1PM

## 2021-09-05 ENCOUNTER — Other Ambulatory Visit: Payer: 59

## 2021-09-26 ENCOUNTER — Ambulatory Visit: Payer: 59

## 2021-09-26 DIAGNOSIS — G43709 Chronic migraine without aura, not intractable, without status migrainosus: Secondary | ICD-10-CM | POA: Diagnosis not present

## 2021-09-26 DIAGNOSIS — I6381 Other cerebral infarction due to occlusion or stenosis of small artery: Secondary | ICD-10-CM

## 2021-09-26 DIAGNOSIS — G4733 Obstructive sleep apnea (adult) (pediatric): Secondary | ICD-10-CM

## 2021-09-26 MED ORDER — GADOBENATE DIMEGLUMINE 529 MG/ML IV SOLN
10.0000 mL | Freq: Once | INTRAVENOUS | Status: AC | PRN
  Administered 2021-09-26: 10 mL via INTRAVENOUS

## 2021-09-28 NOTE — Progress Notes (Signed)
Kindly inform the patient that MRI scan of the brain shows mild changes of hardening of the arteries in the tiny fluid collection in the left side in the deep portion of the brain which is likely a benign lesion.  Nothing to worry about

## 2021-10-02 ENCOUNTER — Telehealth: Payer: Self-pay | Admitting: *Deleted

## 2021-10-02 NOTE — Telephone Encounter (Signed)
-----   Message from Garvin Fila, MD sent at 09/28/2021  5:38 PM EDT ----- Mitchell Heir inform the patient that MRI scan of the brain shows mild changes of hardening of the arteries in the tiny fluid collection in the left side in the deep portion of the brain which is likely a benign lesion.  Nothing to worry about

## 2021-10-02 NOTE — Telephone Encounter (Signed)
Pt verified by name and DOB,  normal results given per provider, pt voiced understanding all question answered. °

## 2021-10-27 ENCOUNTER — Ambulatory Visit: Payer: 59 | Admitting: Medical

## 2021-10-27 VITALS — BP 136/84 | HR 75 | Temp 98.0°F | Resp 16 | Ht 60.0 in | Wt 131.6 lb

## 2021-10-27 DIAGNOSIS — R739 Hyperglycemia, unspecified: Secondary | ICD-10-CM | POA: Diagnosis not present

## 2021-10-27 DIAGNOSIS — G629 Polyneuropathy, unspecified: Secondary | ICD-10-CM

## 2021-10-27 DIAGNOSIS — B351 Tinea unguium: Secondary | ICD-10-CM | POA: Diagnosis not present

## 2021-10-27 DIAGNOSIS — L608 Other nail disorders: Secondary | ICD-10-CM

## 2021-10-27 DIAGNOSIS — E119 Type 2 diabetes mellitus without complications: Secondary | ICD-10-CM

## 2021-10-27 LAB — COMPREHENSIVE METABOLIC PANEL
ALT: 32 U/L (ref 0–35)
AST: 29 U/L (ref 0–37)
Albumin: 4.6 g/dL (ref 3.5–5.2)
Alkaline Phosphatase: 63 U/L (ref 39–117)
BUN: 10 mg/dL (ref 6–23)
CO2: 26 mEq/L (ref 19–32)
Calcium: 9.2 mg/dL (ref 8.4–10.5)
Chloride: 104 mEq/L (ref 96–112)
Creatinine, Ser: 0.48 mg/dL (ref 0.40–1.20)
GFR: 100.92 mL/min (ref >60.00)
Glucose, Bld: 107 mg/dL — ABNORMAL HIGH (ref 70–99)
Potassium: 4 mEq/L (ref 3.5–5.1)
Sodium: 138 mEq/L (ref 135–145)
Total Bilirubin: 0.4 mg/dL (ref 0.2–1.2)
Total Protein: 7.2 g/dL (ref 6.0–8.3)

## 2021-10-27 LAB — VITAMIN B12: Vitamin B-12: 510 pg/mL (ref 211–911)

## 2021-10-27 MED ORDER — METFORMIN HCL 500 MG PO TABS: 500.0000 mg | ORAL_TABLET | Freq: Every day | ORAL | 3 refills | Status: AC

## 2021-10-27 MED ORDER — GABAPENTIN 100 MG PO CAPS: 100.0000 mg | ORAL_CAPSULE | Freq: Three times a day (TID) | ORAL | 3 refills | Status: AC

## 2021-10-27 NOTE — Patient Instructions (Addendum)
Neuropathy- check b12 and b1 level. If normal then treat for diabetic neuropathy with gabapentin.  For diabetes- rx metformin 500 mg daily.   Thick nails- will send nail portion out for koh. Get cmp to check lfts. If + fungal study ad normal liver enzymes rx lamisil.  Follow up date to be determined after lab review.

## 2021-10-27 NOTE — Addendum Note (Signed)
Addended by: Anabel Halon on: 10/27/2021 04:29 PM   Modules accepted: Orders

## 2021-10-27 NOTE — Progress Notes (Signed)
Subjective:    Patient ID: Jacqueline Vasquez, female    DOB: 01-28-1961, 61 y.o.   MRN: [id number]  HPI  Pt in with bilateral burning and tingling for 2 months. Her feet also hurt on standing at work. She stands often 6-8 hours at work.    Also she points out thick great toe nails for 2 years.   Review of Systems  Constitutional:  Negative for chills, fatigue and fever.  Respiratory:  Negative for cough, chest tightness, shortness of breath and wheezing.   Cardiovascular:  Negative for chest pain and palpitations.  Genitourinary:  Negative for dysuria, flank pain, frequency, hematuria and urgency.  Musculoskeletal:  Negative for back pain and neck pain.       Feet pain  Skin:  Negative for rash.       Thick nails.  Neurological:  Negative for dizziness and light-headedness.       Neuropathy.  Hematological:  Negative for adenopathy. Does not bruise/bleed easily.    Past Medical History:  Diagnosis Date   Hyperlipidemia    IBS (irritable bowel syndrome)    Osteoporosis    Vitamin D deficiency      Social History   Socioeconomic History   Marital status: Married    Spouse name: Not on file   Number of children: Not on file   Years of education: Not on file   Highest education level: Not on file  Occupational History   Not on file  Tobacco Use   Smoking status: Former    Packs/day: 0.50    Years: 28.00    Total pack years: 14.00    Types: Cigarettes    Quit date: 11/17/2010    Years since quitting: 10.9   Smokeless tobacco: Never  Vaping Use   Vaping Use: Never used  Substance and Sexual Activity   Alcohol use: Never   Drug use: Never   Sexual activity: Not Currently    Birth control/protection: None  Other Topics Concern   Not on file  Social History Narrative   ** Merged History Encounter **       Social Determinants of Health   Financial Resource Strain: Not on file  Food Insecurity: Not on file  Transportation Needs: Not on file  Physical Activity: Not  on file  Stress: Not on file  Social Connections: Not on file  Intimate Partner Violence: Not on file    Past Surgical History:  Procedure Laterality Date   CESAREAN SECTION     EYE SURGERY      No family history on file.  Allergies  Allergen Reactions   Ibuprofen Nausea And Vomiting and Other (See Comments)    GI upset GI upset     Current Outpatient Medications on File Prior to Visit  Medication Sig Dispense Refill   ASPIRIN 81 PO Take 1 tablet by mouth daily.     butalbital-acetaminophen-caffeine (FIORICET) 50-325-40 MG tablet Take 1 tablet by mouth every 6 (six) hours as needed for headache. 10 tablet 3   Cholecalciferol 50 MCG (2000 UT) TABS Take by mouth.     cyclobenzaprine (FLEXERIL) 5 MG tablet Take 1 tablet (5 mg total) by mouth at bedtime. 5 tablet 0   dicyclomine (BENTYL) 20 MG tablet 1-2 tab daily. 180 tablet 3   estradiol (ESTRACE) 0.1 MG/GM vaginal cream Apply 1 gram per vagina every night for 2 weeks, then apply two to three times a week 30 g 12   estradiol (ESTRACE) 0.1 MG/GM vaginal  cream Apply 1 gram per vagina every night for 2 weeks, then apply three times a week 30 g 12   omeprazole (PRILOSEC) 20 MG capsule Take 1 capsule (20 mg total) by mouth daily. 90 capsule 3   Ospemifene 60 MG TABS Take by mouth.     risedronate (ACTONEL) 150 MG tablet TAKE 1 TABLET BY MOUTH EVERY 30 (THIRTY) DAYS.     rosuvastatin (CRESTOR) 20 MG tablet Take 1 tablet (20 mg total) by mouth daily. 90 tablet 3   sodium polystyrene (KAYEXALATE) 15 GM/60ML suspension 60 ml po for 4 days. 240 mL 0   No current facility-administered medications on file prior to visit.    BP 136/84 (BP Location: Right Arm, Patient Position: Sitting, Cuff Size: Normal)   Pulse 75   Temp 98 F (36.7 C) (Oral)   Resp 16   Ht 5' (1.524 m)   Wt 131 lb 9.6 oz (59.7 kg)   SpO2 96%   BMI 25.70 kg/m        Objective:   Physical Exam  General- No acute distress. Pleasant patient. Neck- Full range  of motion, no jvd Lungs- Clear, even and unlabored. Heart- regular rate and rhythm. Neurologic- CNII- XII grossly intact.  Feet- thick great toe nails. Pulse intact. Normal sensation. On palpation of feet no pain.        Assessment & Plan:   Patient Instructions  Neuropathy- check b12 and b1 level. If normal then treat for diabetic neuropathy with gabapentin.  For diabetes- rx metformin 500 mg daily.   Thick nails- will send nail portion out for koh. Get cmp to check lfts. If + fungal study ad normal liver enzymes rx lamisil.  Follow up date to be determined after lab review.

## 2021-10-30 LAB — FUNGAL STAIN
FUNGAL SMEAR:: NONE SEEN
MICRO NUMBER:: 13767778
SPECIMEN QUALITY:: ADEQUATE

## 2021-11-01 LAB — VITAMIN B1: Vitamin B1 (Thiamine): 7 nmol/L — ABNORMAL LOW (ref 8–30)

## 2021-12-20 ENCOUNTER — Ambulatory Visit: Payer: 59 | Admitting: Adult Health

## 2022-01-15 NOTE — Progress Notes (Unsigned)
No chief complaint on file.     ASSESSMENT AND PLAN  Jacqueline Vasquez is a 61 y.o. 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  MRI brain w wo contrast 09/26/2021 IMPRESSION:  MRI brain (with and without) demonstrating: -Left posterior putamen chronic lacunar ischemic infarct versus benign enlarged perivascular spaces. -Mild periventricular and subcortical chronic small vessel ischemic disease. -No acute findings.   Lab work 08/2021: A1c 6.6 TSH 1.060 Sed rate 8 CRP 1 ANA negative RPR nonreactive  Lab work 10/2021: B12 510 Vitamin B1 7    MEDICAL HISTORY:  Jacqueline Vasquez is a 61 year old female, seen in request by her PCP PA Saguier, Percell Miller for evaluation of headaches, initial evaluation was on June 6th 2023.  I reviewed and summarized the referring note. PMHX. HLD Osteoporosis Cataract surgery.  She has history of headaches, but only happen occasionally, last for few hours, NSAIDs will help her headaches. She complains of more frequent headaches, especially since May 2023, left side moderate headaches, with light noise sensitivity, dizziness, floating in her vision, dizziness, unbalanced, lasting for few hours, she would afraid of driving during episode. He also occasionally sharp transient ice piercing headaches  I personally reviewed CT head on May 10th 2023, no acute abnormality, mild small vessel disease, chronic let basal ganglia infarction.  She also complains of loud snoring, frequent awaking, gasping for  air, excessive day time sleepiness and fatigue.    Update 01/17/2022 JM: Patient returns for follow-up visit regarding migraine headaches after prior initial consult visit with Dr. Krista Blue 5 months ago.   Referred for sleep study evaluation at prior visit but patient unable to be contacted to schedule initial evaluation.    Completed MRI brain 09/2021 which showed left posterior putamen chronic lacunar ischemic infarct vs benign enlarged perivascular spaces, and small vessel disease.     PHYSICAL EXAM:   There were no vitals filed for this visit.    There is no height or weight on file to calculate BMI.  PHYSICAL EXAMNIATION:  Gen: NAD, conversant, well nourised, well groomed                     Cardiovascular: Regular rate rhythm, no peripheral edema, warm, nontender. Eyes: Conjunctivae clear without exudates or hemorrhage Neck: Supple, no carotid bruits. Pulmonary: Clear to auscultation bilaterally   NEUROLOGICAL EXAM:  MENTAL STATUS: Speech/cognition: Awake, alert, oriented to history taking and casual conversation CRANIAL NERVES: CN II: Visual fields are full to confrontation. Pupils are round equal and briskly reactive to light. CN III, IV, VI: extraocular movement are normal. No ptosis. CN V: Facial sensation is intact to light touch CN VII: Face is symmetric with normal eye closure  CN VIII: Hearing is normal to causal conversation. CN IX, X: Phonation is normal. CN XI: Head turning and shoulder shrug are intact CN XII: Narrow pharyngeal space  MOTOR: There is no pronator drift of out-stretched arms. Muscle bulk and tone are normal. Muscle strength is normal.  REFLEXES: Reflexes are 2+ and symmetric at the  biceps, triceps, knees, and ankles. Plantar responses are flexor.  SENSORY: Intact to light touch, pinprick and vibratory sensation are intact in fingers and toes.  COORDINATION: There is no trunk or limb dysmetria noted.  GAIT/STANCE: Posture is normal.  Gait is steady with normal steps, base, arm swing, and turning. Heel and toe walking are normal. Tandem gait is normal.  Romberg is absent.  REVIEW OF SYSTEMS:  Full 14 system review of systems performed and notable only for as above All other review of systems were negative.   ALLERGIES: Allergies  Allergen Reactions   Ibuprofen Nausea And Vomiting and Other (See Comments)    GI upset GI upset     HOME MEDICATIONS: Current Outpatient Medications  Medication Sig Dispense Refill   ASPIRIN 81 PO Take 1 tablet by mouth daily.     butalbital-acetaminophen-caffeine (FIORICET) 50-325-40 MG tablet Take 1 tablet by mouth every 6 (six) hours as needed for headache. 10 tablet 3   Cholecalciferol 50 MCG (2000 UT) TABS Take by mouth.     cyclobenzaprine (FLEXERIL) 5 MG tablet Take 1 tablet (5 mg total) by mouth at bedtime. 5 tablet 0   dicyclomine (BENTYL) 20 MG tablet 1-2 tab daily. 180 tablet 3   estradiol (ESTRACE) 0.1 MG/GM vaginal cream Apply 1 gram per vagina every night for 2 weeks, then apply two to three times a week 30 g 12   estradiol (ESTRACE) 0.1 MG/GM vaginal cream Apply 1 gram per vagina every night for 2 weeks, then apply three times a week 30 g 12   gabapentin (NEURONTIN) 100 MG capsule Take 1 capsule (100 mg total) by mouth 3 (three) times daily. 90 capsule 3   metFORMIN (GLUCOPHAGE) 500 MG tablet Take 1 tablet (500 mg total) by mouth daily with breakfast. 90 tablet 3   omeprazole (PRILOSEC) 20 MG capsule Take 1 capsule (20 mg total) by mouth daily. 90 capsule 3   Ospemifene 60 MG TABS Take by mouth.     risedronate (ACTONEL) 150 MG tablet TAKE 1 TABLET BY MOUTH EVERY 30 (THIRTY) DAYS.     rosuvastatin (CRESTOR) 20 MG tablet Take 1 tablet (20 mg total) by mouth daily. 90 tablet 3   sodium polystyrene (KAYEXALATE) 15 GM/60ML suspension 60 ml po for 4 days. 240 mL 0   No current facility-administered medications for this visit.    PAST MEDICAL HISTORY: Past Medical History:   Diagnosis Date   Hyperlipidemia    IBS (irritable bowel syndrome)    Osteoporosis    Vitamin D deficiency     PAST SURGICAL HISTORY: Past Surgical History:  Procedure Laterality Date   CESAREAN SECTION     EYE SURGERY      FAMILY HISTORY: No family history on file.  SOCIAL HISTORY: Social History   Socioeconomic History   Marital status: Married    Spouse name: Not on file   Number of children: Not on file   Years of education: Not on file   Highest education level: Not on file  Occupational History   Not on file  Tobacco Use   Smoking status: Former    Packs/day: 0.50    Years: 28.00    Total pack years: 14.00    Types: Cigarettes    Quit date: 11/17/2010    Years since quitting: 11.1   Smokeless tobacco: Never  Vaping Use   Vaping Use: Never used  Substance and Sexual Activity   Alcohol use: Never   Drug use: Never  Sexual activity: Not Currently    Birth control/protection: None  Other Topics Concern   Not on file  Social History Narrative   ** Merged History Encounter **       Social Determinants of Health   Financial Resource Strain: Not on file  Food Insecurity: Not on file  Transportation Needs: Not on file  Physical Activity: Not on file  Stress: Not on file  Social Connections: Not on file  Intimate Partner Violence: Not on file       I spent *** minutes of face-to-face and non-face-to-face time with patient.  This included previsit chart review, lab review, study review, order entry, electronic health record documentation, patient education and discussion regarding above diagnoses and treatment plan and answered all the questions to patient satisfaction  Frann Rider, [facility]  [facility] [address] [place], [place] [address]  Phone [phone] Fax [phone] Note: This document was prepared with digital dictation and possible smart phrase technology. Any transcriptional errors that result  from this process are unintentional.

## 2022-01-17 ENCOUNTER — Encounter: Payer: Self-pay | Admitting: Adult Health

## 2022-01-17 ENCOUNTER — Ambulatory Visit: Payer: 59 | Admitting: Adult Health

## 2022-01-17 VITALS — BP 143/87 | HR 87 | Ht 60.0 in | Wt 133.5 lb

## 2022-01-17 DIAGNOSIS — G43709 Chronic migraine without aura, not intractable, without status migrainosus: Secondary | ICD-10-CM

## 2022-01-17 DIAGNOSIS — G44229 Chronic tension-type headache, not intractable: Secondary | ICD-10-CM

## 2022-01-17 NOTE — Patient Instructions (Signed)
Your Plan:  Continue use of Fioricet as needed for acute headache management - if unable to tolerate or not beneficial, please let me know  Please try to do stress relaxation exercises as this could be contributing to your headaches, if your headaches persist or worsen and would like to start on daily mediation for prevention, please let me know  Please schedule initial evaluation with one of our sleep specialists to further evaluation for potential sleep apnea    Follow up in 6 months or call earlier if needed     GENERAL HEADACHE INSTRUCTIONS Headache Preventive Treatment: Please keep in mind that it takes 4-6 weeks for the medication to start working well and 2-3 months at the appropriate dose before deciding if it will be useful or not. If it is not helping at all by this time, then we will discuss other medications to try. Supplements may take 3-6 months until you see full effect.    Natural supplements: Magnesium Oxide or Magnesium Glycinate 500 mg at bed (up to 800 mg daily) Coenzyme Q10 300 mg in AM Vitamin B2- 200 mg twice a day   Add 1 supplement at a time since even natural supplements can have undesirable side effects. You can sometimes buy supplements cheaper (especially Coenzyme Q10) at www.https://compton-perez.com/ or at [employer].   Vitamins and herbs that show potential:   Magnesium: Magnesium (250 mg twice a day or 500 mg at bed) has a relaxant effect on smooth muscles such as blood vessels. Individuals suffering from frequent or daily headache usually have low magnesium levels which can be increase with daily supplementation of 400-750 mg. Three trials found 40-90% average headache reduction  when used as a preventative. Magnesium also demonstrated the benefit in menstrually related migraine.  Magnesium is part of the messenger system in the serotonin cascade and it is a good muscle relaxant.  It is also useful for constipation which can be a side effect of other medications used to  treat migraine. Good sources include nuts, whole grains, and tomatoes. Side Effects: loose stool/diarrhea Riboflavin (vitamin B 2) 200 mg twice a day. This vitamin assists nerve cells in the production of ATP a principal energy storing molecule.  It is necessary for many chemical reactions in the body.  There have been at least 3 clinical trials of riboflavin using 400 mg per day all of which suggested that migraine frequency can be decreased.  All 3 trials showed significant improvement in over half of migraine sufferers.  The supplement is found in bread, cereal, milk, meat, and poultry.  Most Americans get more riboflavin than the recommended daily allowance, however riboflavin deficiency is not necessary for the supplements to help prevent headache. Side effects: energizing, green urine   Coenzyme Q10: This is present in almost all cells in the body and is critical component for the conversion of energy.  Recent studies have shown that a nutritional supplement of CoQ10 can reduce the frequency of migraine attacks by improving the energy production of cells as with riboflavin.  Doses of 150 mg twice a day have been shown to be effective.   Melatonin: Increasing evidence shows correlation between melatonin secretion and headache conditions.  Melatonin supplementation has decreased headache intensity and duration.  It is widely used as a sleep aid.  Sleep is natures way of dealing with migraine.  A dose of 3 mg is recommended to start for headaches including cluster headache. Higher doses up to 15 mg has been reviewed for use  in Cluster headache and have been used. The rationale behind using melatonin for cluster is that many theories regarding the cause of Cluster headache center around the disruption of the normal circadian rhythm in the brain.  This helps restore the normal circadian rhythm.   Ginger: Ginger has a small amount of antihistamine and anti-inflammatory action which may help headache.  It is  primarily used for nausea and may aid in the absorption of other medications. HEADACHE DIET: Foods and beverages which may trigger migraine Note that only 20% of headache patients are food sensitive. You will know if you are food sensitive if you get a headache consistently 20 minutes to 2 hours after eating a certain food. Only cut out a food if it causes headaches, otherwise you might remove foods you enjoy! What matters most for diet is to eat a well balanced healthy diet full of vegetables and low fat protein, and to not miss meals.   Chocolate, other sweets ALL cheeses except cottage and cream cheese Dairy products, yogurt, sour cream, ice cream Liver Meat extracts (Bovril, Marmite, meat tenderizers) Meats or fish which have undergone aging, fermenting, pickling or smoking. These include: Hotdogs,salami,Lox,sausage, mortadellas,smoked salmon, pepperoni, Pickled herring Pods of broad bean (English beans, Chinese pea pods, [place] (fava) beans, lima and navy beans Ripe avocado, ripe banana Yeast extracts or active yeast preparations such as Brewer's or Fleishman's (commercial bakes goods are permitted) Tomato based foods, pizza (lasagna, etc.)   MSG (monosodium glutamate) is disguised as many things; look for these common aliases: Monopotassium glutamate Autolysed yeast Hydrolysed protein Sodium caseinate "flavorings" "all natural preservatives" Nutrasweet   Avoid all other foods that convincingly provoke headaches.   Resources: The Dizzy Lu Duffel Your Headache Diet, migrainestrong.com  https://www.aguirre.org/   Caffeine and Migraine For patients that have migraine, caffeine intake more than 3 days per week can lead to dependency and increased migraine frequency. I would recommend cutting back on your caffeine intake as best you can. The recommended amount of caffeine is 200-300 mg daily, although migraine patients may experience  dependency at even lower doses. While you may notice an increase in headache temporarily, cutting back will be helpful for headaches in the long run. For more information on caffeine and migraine, visit: https://americanmigrainefoundation.org/resource-library/caffeine-and-migraine/   Headache Prevention Strategies:   1. Maintain a headache diary; learn to identify and avoid triggers.  - This can be a simple note where you log when you had a headache, associated symptoms, and medications used - There are several smartphone apps developed to help track migraines: Migraine Buddy, Migraine Monitor, Curelator N1-Headache App   Common triggers include: Emotional triggers: Emotional/Upset family or friends Emotional/Upset occupation Business reversal/success Anticipation anxiety Crisis-serious Post-crisis periodNew job/position   Physical triggers: Vacation Day Weekend Strenuous Exercise High Altitude Location New Move Menstrual Day Physical Illness Oversleep/Not enough sleep Weather changes Light: Photophobia or light sesnitivity treatment involves a balance between desensitization and reduction in overly strong input. Use dark polarized glasses outside, but not inside. Avoid bright or fluorescent light, but do not dim environment to the point that going into a normally lit room hurts. Consider FL-41 tint lenses, which reduce the most irritating wavelengths without blocking too much light.  These can be obtained at axonoptics.com or theraspecs.com Foods: see list above.   2. Limit use of acute treatments (over-the-counter medications, triptans, etc.) to no more than 2 days per week or 10 days per month to prevent medication overuse headache (rebound headache).     3.  Follow a regular schedule (including weekends and holidays): Don't skip meals. Eat a balanced diet. 8 hours of sleep nightly. Minimize stress. Exercise 30 minutes per day. Being overweight is associated with a 5 times  increased risk of chronic migraine. Keep well hydrated and drink 6-8 glasses of water per day.   4. Initiate non-pharmacologic measures at the earliest onset of your headache. Rest and quiet environment. Relax and reduce stress. Breathe2Relax is a free app that can instruct you on    some simple relaxtion and breathing techniques. Http://Dawnbuse.com is a    free website that provides teaching videos on relaxation.  Also, there are  many apps that   can be downloaded for "mindful" relaxation.  An app called YOGA NIDRA will help walk you through mindfulness. Another app called Calm can be downloaded to give you a structured mindfulness guide with daily reminders and skill development. Headspace for guided meditation Mindfulness Based Stress Reduction Online Course: www.palousemindfulness.com Cold compresses.   5. Don't wait!! Take the maximum allowable dosage of prescribed medication at the first sign of migraine.   6. Compliance:  Take prescribed medication regularly as directed and at the first sign of a migraine.   7. Communicate:  Call your physician when problems arise, especially if your headaches change, increase in frequency/severity, or become associated with neurological symptoms (weakness, numbness, slurred speech, etc.).   8. Headache/pain management therapies: Consider various complementary methods, including medication, behavioral therapy, psychological counselling, biofeedback, massage therapy, acupuncture, dry needling, and other modalities.  Such measures may reduce the need for medications. Counseling for pain management, where patients learn to function and ignore/minimize their pain, seems to work very well.   9. Recommend changing family's attention and focus away from patient's headaches. Instead, emphasize daily activities. If first question of day is 'How are your headaches/Do you have a headache today?', then patient will constantly think about headaches, thus making them  worse. Goal is to re-direct attention away from headaches, toward daily activities and other distractions.   10. Helpful Websites: www.AmericanHeadacheSociety.org VoipObserver.it www.headaches.org GolfingFamily.no www.achenet.org      Thank you for coming to see [place] at [facility]. I hope we have been able to provide you high quality care today.  You may receive a patient satisfaction survey over the next few weeks. We would appreciate your feedback and comments so that we may continue to improve ourselves and the health of our patients.

## 2022-01-26 ENCOUNTER — Other Ambulatory Visit: Payer: Self-pay | Admitting: Obstetrics and Gynecology

## 2022-01-26 DIAGNOSIS — N941 Unspecified dyspareunia: Secondary | ICD-10-CM

## 2022-02-22 ENCOUNTER — Institutional Professional Consult (permissible substitution): Payer: 59 | Admitting: Neurology

## 2022-02-25 ENCOUNTER — Other Ambulatory Visit: Payer: Self-pay

## 2022-02-25 ENCOUNTER — Emergency Department (HOSPITAL_BASED_OUTPATIENT_CLINIC_OR_DEPARTMENT_OTHER): Payer: 59

## 2022-02-25 ENCOUNTER — Encounter (HOSPITAL_BASED_OUTPATIENT_CLINIC_OR_DEPARTMENT_OTHER): Payer: Self-pay | Admitting: Emergency Medicine

## 2022-02-25 ENCOUNTER — Emergency Department (HOSPITAL_BASED_OUTPATIENT_CLINIC_OR_DEPARTMENT_OTHER)
Admission: EM | Admit: 2022-02-25 | Discharge: 2022-02-25 | Disposition: A | Discharge status: Home / Self Care | Payer: 59 | Attending: Emergency Medicine | Admitting: Emergency Medicine

## 2022-02-25 DIAGNOSIS — W228XXA Striking against or struck by other objects, initial encounter: Secondary | ICD-10-CM | POA: Insufficient documentation

## 2022-02-25 DIAGNOSIS — W19XXXA Unspecified fall, initial encounter: Secondary | ICD-10-CM

## 2022-02-25 DIAGNOSIS — R0781 Pleurodynia: Secondary | ICD-10-CM | POA: Diagnosis present

## 2022-02-25 HISTORY — DX: Headache, unspecified: R51.9

## 2022-02-25 HISTORY — DX: Gastro-esophageal reflux disease without esophagitis: K21.9

## 2022-02-25 MED ORDER — LIDOCAINE 5 % EX PTCH: 1.0000 | MEDICATED_PATCH | CUTANEOUS | 0 refills | Status: AC

## 2022-02-25 MED ORDER — HYDROCODONE-ACETAMINOPHEN 5-325 MG PO TABS
1.0000 | ORAL_TABLET | Freq: Once | ORAL | Status: AC
  Administered 2022-02-25: 1 via ORAL
  Filled 2022-02-25: qty 1

## 2022-02-25 MED ORDER — LIDOCAINE 5 % EX PTCH
1.0000 | MEDICATED_PATCH | CUTANEOUS | Status: DC
  Administered 2022-02-25: 1 via TRANSDERMAL
  Filled 2022-02-25: qty 1

## 2022-02-25 MED ORDER — HYDROCODONE-ACETAMINOPHEN 5-325 MG PO TABS: 1.0000 | ORAL_TABLET | ORAL | 0 refills | Status: AC | PRN

## 2022-02-25 NOTE — ED Provider Notes (Signed)
[facility] EMERGENCY DEPARTMENT Provider Note   CSN: [id number] Arrival date & time: 02/25/22  0093     History  Chief Complaint  Patient presents with   Jacqueline Vasquez    Jacqueline Vasquez is a 61 y.o. female.  61 year old female with no past medical history presents to the ED status post mechanical fall.  Patient was working out yesterday when suddenly she turned a corner and fell onto the exercise equipment, reports striking the left side of her chest, severe pain to the area.  She did not strike her head and did not lose consciousness.  Pain is located along the left lower chest, worsened with any movement, palpation.  She did have some ibuprofen yesterday without any improvement in symptoms.  Husband reports today the pain intensified in nature, states that she was washing the dishes and she could not continue to stand and move her left arm.  Exacerbated with movement of the left arm. NO shortness of breath, no vomiting, no nausea, no other injuries reported.  The history is provided by the patient.  Fall This is a new problem. The current episode started yesterday. The problem occurs constantly. The problem has not changed since onset.Pertinent negatives include no chest pain and no abdominal pain.       Home Medications Prior to Admission medications   Medication Sig Start Date End Date Taking? Authorizing Provider  gabapentin (NEURONTIN) 100 MG capsule Take 1 capsule (100 mg total) by mouth 3 (three) times daily. 10/27/21  Yes Saguier, Percell Miller, PA-C  HYDROcodone-acetaminophen (NORCO/VICODIN) 5-325 MG tablet Take 1 tablet by mouth every 4 (four) hours as needed for up to 3 days. 02/25/22 02/28/22 Yes [place] Kohan, Beverley Fiedler, PA-C  omeprazole (PRILOSEC) 20 MG capsule Take 1 capsule (20 mg total) by mouth daily. 03/10/21  Yes Saguier, Percell Miller, PA-C  rosuvastatin (CRESTOR) 20 MG tablet Take 1 tablet (20 mg total) by mouth daily. 03/10/21  Yes Saguier, Percell Miller, PA-C  ASPIRIN 81 PO Take 1 tablet by  mouth daily.    [provider]  Cholecalciferol 50 MCG (2000 UT) TABS Take by mouth. 11/20/12   [provider]  cyclobenzaprine (FLEXERIL) 5 MG tablet Take 1 tablet (5 mg total) by mouth at bedtime. 07/24/21   Saguier, Percell Miller, PA-C  dicyclomine (BENTYL) 20 MG tablet 1-2 tab daily. 03/10/21   Saguier, Percell Miller, PA-C  estradiol (ESTRACE) 0.1 MG/GM vaginal cream Apply 1 gram per vagina every night for 2 weeks, then apply two to three times a week 01/02/21   Radene Gunning, MD  estradiol (ESTRACE) 0.1 MG/GM vaginal cream Apply 1 gram per vagina every night for 2 weeks, then apply three times a week 01/02/21   Radene Gunning, MD  metFORMIN (GLUCOPHAGE) 500 MG tablet Take 1 tablet (500 mg total) by mouth daily with breakfast. 10/27/21   Saguier, Percell Miller, PA-C  Ospemifene 60 MG TABS Take by mouth. 12/09/13   [provider]  risedronate (ACTONEL) 150 MG tablet TAKE 1 TABLET BY MOUTH EVERY 30 (THIRTY) DAYS. 04/02/14   [provider]  sodium polystyrene (KAYEXALATE) 15 GM/60ML suspension 60 ml po for 4 days. 07/27/20   Saguier, Percell Miller, PA-C      Allergies    Ibuprofen    Review of Systems   Review of Systems  Constitutional:  Negative for fever.  Cardiovascular:  Negative for chest pain.  Gastrointestinal:  Negative for abdominal pain and nausea.  Musculoskeletal:  Positive for arthralgias.    Physical Exam Updated Vital Signs BP 129/89  Pulse 70   Temp 98 F (36.7 C) (Oral)   Resp 20   SpO2 95%  Physical Exam Vitals and nursing note reviewed.  Constitutional:      Appearance: Normal appearance.  HENT:     Head: Normocephalic and atraumatic.     Comments: No palpable goose egg noted.     Mouth/Throat:     Mouth: Mucous membranes are dry.  Eyes:     Pupils: Pupils are equal, round, and reactive to light.  Cardiovascular:     Rate and Rhythm: Normal rate.  Pulmonary:     Effort: Pulmonary effort is normal.     Breath sounds: No wheezing or rales.  Chest:     Abdominal:     General: Abdomen is flat.  Musculoskeletal:     Cervical back: Normal range of motion and neck supple.  Skin:    General: Skin is warm and dry.  Neurological:     Mental Status: She is alert and oriented to person, place, and time.     ED Results / Procedures / Treatments   Labs (all labs ordered are listed, but only abnormal results are displayed) Labs Reviewed - No data to display  EKG None  Radiology DG Ribs Unilateral W/Chest Left  Result Date: 02/25/2022 CLINICAL DATA:  Tripped over gem a quit mint yesterday. Pain to left rib area. EXAM: LEFT RIBS AND CHEST - 3+ VIEW COMPARISON:  01/05/2020 FINDINGS: No fracture or other bone lesions are seen involving the ribs. There is no evidence of pneumothorax or pleural effusion. Both lungs are clear. Heart size and mediastinal contours are within normal limits. IMPRESSION: Negative. Electronically Signed   By: Kerby Moors M.D.   On: 02/25/2022 10:22    Procedures Procedures    Medications Ordered in ED Medications  lidocaine (LIDODERM) 5 % 1 patch (has no administration in time range)  HYDROcodone-acetaminophen (NORCO/VICODIN) 5-325 MG per tablet 1 tablet (1 tablet Oral Given 02/25/22 1007)    ED Course/ Medical Decision Making/ A&P                           Medical Decision Making Amount and/or Complexity of Data Reviewed Radiology: ordered.  Risk Prescription drug management.   Patient presents to the ED status post mechanical fall yesterday onto some gym equipment.  Endorsing left-sided chest pain worsened with any movement, with lifting of her left arm.  She has taken some ibuprofen yesterday without any improvement in symptoms.  Reports today the pain intensified.  She did not strike her head, there was no loss of consciousness.  During evaluation patient is overall uncomfortable, she does endorse severe pain along the left side of her chest specially with lying on the bed and lying flat.  Lungs  are clear to auscultation.  No crepitus was felt on the left side of her rib area.  No bruising noted or abrasions.  Xray of her chest with No signs of pneumothorax, no acute findings.   I discussed these results with patient and husband at the bedside, they are aware if this is likely a rib fracture, she will be sent home with lidocaine patches, pain control, incentive spirometer.  Did advise them to have a repeat x-ray within the next week in order to further evaluate and rule out any consequences such as developing pneumonia.  She is not hypoxic here, mentating well with stable vital signs I do feel that patient is stable for  outpatient follow-up.  Patient discharged from the emergency department in stable condition.   Portions of this note were generated with [employer]. Dictation errors may occur despite best attempts at proofreading.   Final Clinical Impression(s) / ED Diagnoses Final diagnoses:  Fall, initial encounter  Rib pain on left side    Rx / DC Orders ED Discharge Orders          Ordered    HYDROcodone-acetaminophen (NORCO/VICODIN) 5-325 MG tablet  Every 4 hours PRN        02/25/22 1037              Janeece Fitting, PA-C 02/25/22 [place], [place], DO 02/25/22 1044

## 2022-02-25 NOTE — ED Notes (Signed)
Discharge instructions reviewed with patient. Patient verbalizes understanding, no further questions at this time. Medications/prescriptions and follow up information provided. No acute distress noted at time of departure.  

## 2022-02-25 NOTE — Discharge Instructions (Signed)
We discussed the results of your x-ray on today's visit.  You are going to be sent home with a short course of pain medication to help with your symptoms.  You will also be sent home with an incentive spirometer, please use this to prevent developing complications such as pneumonia.

## 2022-02-25 NOTE — ED Triage Notes (Signed)
Tripped over gym equipment yesterday. No LOC, hit machine on her left side. Pain to left rib area.

## 2022-02-25 NOTE — ED Notes (Signed)
Instructed to do IS every 2 hours, stressed importance breath hold with IS.

## 2022-02-27 ENCOUNTER — Encounter: Payer: Self-pay | Admitting: Medical

## 2022-02-27 ENCOUNTER — Ambulatory Visit: Payer: 59 | Admitting: Medical

## 2022-02-27 VITALS — BP 140/80 | HR 76 | Resp 18 | Ht 61.0 in | Wt 132.0 lb

## 2022-02-27 DIAGNOSIS — R0781 Pleurodynia: Secondary | ICD-10-CM | POA: Diagnosis not present

## 2022-02-27 DIAGNOSIS — Z23 Encounter for immunization: Secondary | ICD-10-CM | POA: Diagnosis not present

## 2022-02-27 MED ORDER — KETOROLAC TROMETHAMINE 30 MG/ML IJ SOLN
30.0000 mg | Freq: Once | INTRAMUSCULAR | Status: AC
  Administered 2022-02-27: 30 mg via INTRAMUSCULAR

## 2022-02-27 NOTE — Addendum Note (Signed)
Addended by: Jeronimo Greaves on: 02/27/2022 10:55 AM   Modules accepted: Orders

## 2022-02-27 NOTE — Progress Notes (Signed)
Subjective:    Patient ID: Jacqueline Vasquez, female    DOB: 1960-09-12, 61 y.o.   MRN: [id number]  HPI Pt had recent fall. Pt tripped on husband total gym at home. She landed on her left ribs. Pt went to emergency department and was evaluated.   Pt was seen 2 days ago in the ED on 02/25/22    Fall      "Jacqueline Vasquez is a 61 y.o. female.   61 year old female with no past medical history presents to the ED status post mechanical fall.  Patient was working out yesterday when suddenly she turned a corner and fell onto the exercise equipment, reports striking the left side of her chest, severe pain to the area.  She did not strike her head and did not lose consciousness.  Pain is located along the left lower chest, worsened with any movement, palpation.  She did have some ibuprofen yesterday without any improvement in symptoms.  Husband reports today the pain intensified in nature, states that she was washing the dishes and she could not continue to stand and move her left arm.  Exacerbated with movement of the left arm. NO shortness of breath, no vomiting, no nausea, no other injuries reported.   The history is provided by the patient.  Fall This is a new problem. The current episode started yesterday. The problem occurs constantly. The problem has not changed since onset.Pertinent negatives include no chest pain and no abdominal pain."    Abdominal:     General: Abdomen is flat.  Musculoskeletal:     Cervical back: Normal range of motion and neck supple.  Skin:    General: Skin is warm and dry.  Neurological:     Mental Status: She is alert and oriented to person, place, and time."   Above exam done by ED.  Medical decision making   Medical Decision Making Amount and/or Complexity of Data Reviewed Radiology: ordered.   Risk Prescription drug management.     Patient presents to the ED status post mechanical fall yesterday onto some gym equipment.  Endorsing left-sided chest pain  worsened with any movement, with lifting of her left arm.  She has taken some ibuprofen yesterday without any improvement in symptoms.  Reports today the pain intensified.  She did not strike her head, there was no loss of consciousness.   During evaluation patient is overall uncomfortable, she does endorse severe pain along the left side of her chest specially with lying on the bed and lying flat.  Lungs are clear to auscultation.  No crepitus was felt on the left side of her rib area.  No bruising noted or abrasions.   Xray of her chest with No signs of pneumothorax, no acute findings.   I discussed these results with patient and husband at the bedside, they are aware if this is likely a rib fracture, she will be sent home with lidocaine patches, pain control, incentive spirometer.  Did advise them to have a repeat x-ray within the next week in order to further evaluate and rule out any consequences such as developing pneumonia.  She is not hypoxic here, mentating well with stable vital signs I do feel that patient is stable for outpatient follow-up.  Patient discharged from the emergency department in stable condition."   Pt states pain can be severe when takes a deep breath or when she cough. Initial xray was negative.  Pt was given norco 5-325. Pt gets some nause with that dose.  If she takes half tab does not get nausea. Pain not present if sits still, not coughing and not taking deep breath.  Pt also used lidocaine patch. But pt thinks does not help.  Review of Systems  Constitutional:  Negative for chills, fatigue and fever.  Respiratory:  Negative for cough, chest tightness, shortness of breath and wheezing.   Cardiovascular:  Negative for chest pain and palpitations.  Gastrointestinal:  Negative for abdominal pain.  Musculoskeletal:        Left side rib pain.  Neurological:  Negative for dizziness, seizures, numbness and headaches.  Hematological:  Negative for adenopathy. Does not  bruise/bleed easily.  Psychiatric/Behavioral:  Negative for behavioral problems and confusion.    Past Medical History:  Diagnosis Date   Generalized headaches    GERD (gastroesophageal reflux disease)    Hyperlipidemia    IBS (irritable bowel syndrome)    Osteoporosis    Vitamin D deficiency      Social History   Socioeconomic History   Marital status: Married    Spouse name: Not on file   Number of children: Not on file   Years of education: Not on file   Highest education level: Not on file  Occupational History   Not on file  Tobacco Use   Smoking status: Former    Packs/day: 0.50    Years: 28.00    Total pack years: 14.00    Types: Cigarettes    Quit date: 11/17/2010    Years since quitting: 11.2   Smokeless tobacco: Never  Vaping Use   Vaping Use: Never used  Substance and Sexual Activity   Alcohol use: Never   Drug use: Never   Sexual activity: Not Currently    Birth control/protection: None  Other Topics Concern   Not on file  Social History Narrative   ** Merged History Encounter **       Social Determinants of Health   Financial Resource Strain: Not on file  Food Insecurity: Not on file  Transportation Needs: Not on file  Physical Activity: Not on file  Stress: Not on file  Social Connections: Not on file  Intimate Partner Violence: Not on file    Past Surgical History:  Procedure Laterality Date   CESAREAN SECTION     EYE SURGERY      No family history on file.  Allergies  Allergen Reactions   Ibuprofen Nausea And Vomiting and Other (See Comments)    GI upset GI upset If does not have coating    Current Outpatient Medications on File Prior to Visit  Medication Sig Dispense Refill   ASPIRIN 81 PO Take 1 tablet by mouth daily.     Cholecalciferol 50 MCG (2000 UT) TABS Take by mouth.     cyclobenzaprine (FLEXERIL) 5 MG tablet Take 1 tablet (5 mg total) by mouth at bedtime. 5 tablet 0   dicyclomine (BENTYL) 20 MG tablet 1-2 tab daily.  180 tablet 3   estradiol (ESTRACE) 0.1 MG/GM vaginal cream Apply 1 gram per vagina every night for 2 weeks, then apply two to three times a week 30 g 12   estradiol (ESTRACE) 0.1 MG/GM vaginal cream Apply 1 gram per vagina every night for 2 weeks, then apply three times a week 30 g 12   gabapentin (NEURONTIN) 100 MG capsule Take 1 capsule (100 mg total) by mouth 3 (three) times daily. 90 capsule 3   HYDROcodone-acetaminophen (NORCO/VICODIN) 5-325 MG tablet Take 1 tablet by mouth every 4 (  four) hours as needed for up to 3 days. 10 tablet 0   lidocaine (LIDODERM) 5 % Place 1 patch onto the skin daily for 10 days. Remove & Discard patch within 12 hours or as directed by MD 10 patch 0   metFORMIN (GLUCOPHAGE) 500 MG tablet Take 1 tablet (500 mg total) by mouth daily with breakfast. 90 tablet 3   omeprazole (PRILOSEC) 20 MG capsule Take 1 capsule (20 mg total) by mouth daily. 90 capsule 3   Ospemifene 60 MG TABS Take by mouth.     risedronate (ACTONEL) 150 MG tablet TAKE 1 TABLET BY MOUTH EVERY 30 (THIRTY) DAYS.     rosuvastatin (CRESTOR) 20 MG tablet Take 1 tablet (20 mg total) by mouth daily. 90 tablet 3   sodium polystyrene (KAYEXALATE) 15 GM/60ML suspension 60 ml po for 4 days. 240 mL 0   No current facility-administered medications on file prior to visit.    BP (!) 140/80   Pulse 75   Resp 18   Ht 5' 1"  (1.549 m)   Wt 132 lb (59.9 kg)   SpO2 98%   BMI 24.94 kg/m        Objective:   Physical Exam  General- No acute distress. Pleasant patient. Neck- Full range of motion, no jvd. No tracheal deviation. Lungs- Clear, even and unlabored. Heart- regular rate and rhythm. Neurologic- CNII- XII grossly intact.  Anterior thorax- left side rib very tender to palpation. Pain on breathing.      Assessment & Plan:   Patient Instructions  Left rib pain after fall.  Initial x-rays were negative but clinically I do have high suspicion for probable small fracture of ribs.  I do agree with  emergency department recommendation to repeat x-ray.  However its only been 2 days since the injury and I think it is too early to repeat the x-ray.  Typically best to repeat x-ray 7 to 10 days after the first x-ray.  Placing future left rib series with chest to be done this coming Monday morning.  For pain we gave you Toradol 30 mg IM.  Continue with Norco but use 1/2 tablet every 8 hours since taking a whole tablet makes you nauseous.  Can add on Aleve over-the-counter 1 tablet every 12 hours if needed.  Make sure that you have some food in your stomach before taking NSAID.  If it bothers your stomach then stop Aleve and notify us.  Work excuse written to be off pending x-ray results.  If not feeling better/with less pain might need to give you numerous weeks off due to nature of your work.  Follow-up date to be determined after x-ray review on Monday.   Mackie Pai, PA-C

## 2022-02-27 NOTE — Patient Instructions (Signed)
Left rib pain after fall.  Initial x-rays were negative but clinically I do have high suspicion for probable small fracture of ribs.  I do agree with emergency department recommendation to repeat x-ray.  However its only been 2 days since the injury and I think it is too early to repeat the x-ray.  Typically best to repeat x-ray 7 to 10 days after the first x-ray.  Placing future left rib series with chest to be done this coming Monday morning.  For pain we gave you Toradol 30 mg IM.  Continue with Norco but use 1/2 tablet every 8 hours since taking a whole tablet makes you nauseous.  Can add on Aleve over-the-counter 1 tablet every 12 hours if needed.  Make sure that you have some food in your stomach before taking NSAID.  If it bothers your stomach then stop Aleve and notify us.  Work excuse written to be off pending x-ray results.  If not feeling better/with less pain might need to give you numerous weeks off due to nature of your work.  Follow-up date to be determined after x-ray review on Monday.

## 2022-03-05 ENCOUNTER — Ambulatory Visit (HOSPITAL_BASED_OUTPATIENT_CLINIC_OR_DEPARTMENT_OTHER)
Admission: RE | Admit: 2022-03-05 | Discharge: 2022-03-05 | Disposition: A | Discharge status: Home / Self Care | Payer: 59 | Source: Ambulatory Visit | Attending: Medical | Admitting: Medical

## 2022-03-05 ENCOUNTER — Encounter: Payer: Self-pay | Admitting: Medical

## 2022-03-05 DIAGNOSIS — R0781 Pleurodynia: Secondary | ICD-10-CM | POA: Diagnosis present

## 2022-03-07 ENCOUNTER — Ambulatory Visit: Payer: 59 | Admitting: Medical

## 2022-03-07 VITALS — BP 133/80 | HR 85 | Temp 98.2°F | Resp 18 | Ht 61.0 in | Wt 133.6 lb

## 2022-03-07 DIAGNOSIS — R0781 Pleurodynia: Secondary | ICD-10-CM

## 2022-03-07 DIAGNOSIS — R21 Rash and other nonspecific skin eruption: Secondary | ICD-10-CM

## 2022-03-07 MED ORDER — TRIAMCINOLONE ACETONIDE 0.1 % EX CREA: 1.0000 | TOPICAL_CREAM | Freq: Two times a day (BID) | CUTANEOUS | 0 refills | Status: AC

## 2022-03-07 NOTE — Progress Notes (Unsigned)
Subjective:    Patient ID: Jacqueline Vasquez, female    DOB: 1960-09-16, 61 y.o.   MRN: [id number]  HPI Pt is still having rib pain but it is much less frequent now. Pain is now mostly when she is bending over and twist. See last note. I repeated xray to see if fracture. She has to lift cabinet doors and large doors. No sob or wheezing. No cough.   Pt at home and not working due to intermittent pain. When she drops things and bends over will have severe pain.   IMPRESSION: 1. Equivocal fracture deformity involving the anterior aspect of the left fifth rib is blurred by motion artifact. Correlate for any focal tenderness in this area. 2. No acute cardiopulmonary abnormalities.   Now 10 days since original injury. Work has been asking when she will return to work.  Husband doubts light duty since she can't read [place].  Pt has been using alleve over the counter and norco 1/2 tab at night.  Intermittent rash with itching over the years in left trapezius area. She states prior pcp in past had given treatment that resolved but recently returned.  Review of Systems  Constitutional:  Negative for chills, fatigue and fever.  Respiratory:  Negative for cough, chest tightness, shortness of breath and wheezing.   Cardiovascular:  Negative for chest pain and palpitations.  Gastrointestinal:  Negative for abdominal pain.  Musculoskeletal:  Negative for back pain.       Left rib pain  Skin:  Positive for rash.  Hematological:  Negative for adenopathy. Does not bruise/bleed easily.  Psychiatric/Behavioral:  Negative for behavioral problems and confusion.     Past Medical History:  Diagnosis Date   Generalized headaches    GERD (gastroesophageal reflux disease)    Hyperlipidemia    IBS (irritable bowel syndrome)    Osteoporosis    Vitamin D deficiency      Social History   Socioeconomic History   Marital status: Married    Spouse name: Not on file   Number of children: Not on file    Years of education: Not on file   Highest education level: Not on file  Occupational History   Not on file  Tobacco Use   Smoking status: Former    Packs/day: 0.50    Years: 28.00    Total pack years: 14.00    Types: Cigarettes    Quit date: 11/17/2010    Years since quitting: 11.3   Smokeless tobacco: Never  Vaping Use   Vaping Use: Never used  Substance and Sexual Activity   Alcohol use: Never   Drug use: Never   Sexual activity: Not Currently    Birth control/protection: None  Other Topics Concern   Not on file  Social History Narrative   ** Merged History Encounter **       Social Determinants of Health   Financial Resource Strain: Not on file  Food Insecurity: Not on file  Transportation Needs: Not on file  Physical Activity: Not on file  Stress: Not on file  Social Connections: Not on file  Intimate Partner Violence: Not on file    Past Surgical History:  Procedure Laterality Date   CESAREAN SECTION     EYE SURGERY      No family history on file.  Allergies  Allergen Reactions   Ibuprofen Nausea And Vomiting and Other (See Comments)    GI upset GI upset If does not have coating    Current  Outpatient Medications on File Prior to Visit  Medication Sig Dispense Refill   ASPIRIN 81 PO Take 1 tablet by mouth daily.     Cholecalciferol 50 MCG (2000 UT) TABS Take by mouth.     cyclobenzaprine (FLEXERIL) 5 MG tablet Take 1 tablet (5 mg total) by mouth at bedtime. 5 tablet 0   dicyclomine (BENTYL) 20 MG tablet 1-2 tab daily. 180 tablet 3   estradiol (ESTRACE) 0.1 MG/GM vaginal cream Apply 1 gram per vagina every night for 2 weeks, then apply two to three times a week 30 g 12   estradiol (ESTRACE) 0.1 MG/GM vaginal cream Apply 1 gram per vagina every night for 2 weeks, then apply three times a week 30 g 12   gabapentin (NEURONTIN) 100 MG capsule Take 1 capsule (100 mg total) by mouth 3 (three) times daily. 90 capsule 3   lidocaine (LIDODERM) 5 % Place 1 patch  onto the skin daily for 10 days. Remove & Discard patch within 12 hours or as directed by MD 10 patch 0   metFORMIN (GLUCOPHAGE) 500 MG tablet Take 1 tablet (500 mg total) by mouth daily with breakfast. 90 tablet 3   omeprazole (PRILOSEC) 20 MG capsule Take 1 capsule (20 mg total) by mouth daily. 90 capsule 3   Ospemifene 60 MG TABS Take by mouth.     risedronate (ACTONEL) 150 MG tablet TAKE 1 TABLET BY MOUTH EVERY 30 (THIRTY) DAYS.     rosuvastatin (CRESTOR) 20 MG tablet Take 1 tablet (20 mg total) by mouth daily. 90 tablet 3   sodium polystyrene (KAYEXALATE) 15 GM/60ML suspension 60 ml po for 4 days. 240 mL 0   No current facility-administered medications on file prior to visit.    BP 133/80   Pulse 85   Temp 98.2 F (36.8 C)   Resp 18   Ht 5' 1"  (1.549 m)   Wt 133 lb 9.6 oz (60.6 kg)   SpO2 92%   BMI 25.24 kg/m        Objective:   Physical Exam  General- No acute distress. Pleasant patient. Neck- Full range of motion, no jvd Lungs- Clear, even and unlabored. Heart- regular rate and rhythm. Neurologic- CNII- XII grossly intact.  Anterior chest- left rib mild to moderate pain over 5th rib in area in question on xray.  Left trapezius- slight flaky dry appearing rash. Area about 1 cm x 2 cm.    Assessment & Plan:   Patient Instructions  Left rib pain on palpation in region of are described by radiologist as   1. Equivocal fracture deformity involving the anterior aspect of the left fifth rib is blurred by motion artifact. Correlate for any focal tenderness in this area.   At this point bruised rib vs fracture. Pain is less but significant and sharp on bending and lifting which is required for your job.  Recommend continue current regimen of alleve and norco.  Write work not for additional 10 days off from work. If pain decreasing then can try to return to work. If you feel like you can't work then get 3rd xray.   For itchy rash to left trapezius. Rx triamcinolone  twice daily.  Follow up 14 days or sooner if needed.  Mackie Pai, PA-C

## 2022-03-07 NOTE — Patient Instructions (Addendum)
Left rib pain on palpation in region of are described by radiologist as   1. Equivocal fracture deformity involving the anterior aspect of the left fifth rib is blurred by motion artifact. Correlate for any focal tenderness in this area.   At this point bruised rib vs fracture. Pain is less but significant and sharp on bending and lifting which is required for your job.  Recommend continue current regimen of alleve and norco.  Write work not for additional 10 days off from work. If pain decreasing then can try to return to work. If you feel like you can't work then get 3rd xray.  For itchy rash to left trapezius. Rx triamcinolone twice daily.  Follow up 14 days or sooner if needed.

## 2022-03-14 ENCOUNTER — Encounter: Payer: Self-pay | Admitting: Medical

## 2022-03-14 ENCOUNTER — Telehealth: Payer: Self-pay | Admitting: Medical

## 2022-03-14 NOTE — Telephone Encounter (Signed)
Called patient and left message for patient in reference to [facility] paperwork. Patient PCP is Dr. Harvie Heck. Patient is not a patient of Dr. Mitchel Honour.

## 2022-03-14 NOTE — Telephone Encounter (Signed)
Patient came in and dropped off FMLA paperwork. Patient would like to be called when forms are ready to be picked up. Patient callback number is [phone]. Forms placed in Providers box at the front.

## 2022-03-15 ENCOUNTER — Other Ambulatory Visit: Payer: Self-pay | Admitting: Medical

## 2022-03-15 NOTE — Telephone Encounter (Signed)
Pt scheduled for an appt 03/16/22 for paperwork to be filled out

## 2022-03-16 ENCOUNTER — Ambulatory Visit: Payer: 59 | Admitting: Medical

## 2022-03-16 VITALS — BP 136/90 | HR 79 | Temp 98.2°F | Resp 18 | Ht 61.0 in | Wt 136.0 lb

## 2022-03-16 DIAGNOSIS — R0781 Pleurodynia: Secondary | ICD-10-CM

## 2022-03-16 NOTE — Progress Notes (Signed)
Subjective:    Patient ID: Jacqueline Vasquez, female    DOB: 27-Feb-1961, 61 y.o.   MRN: [id number]  HPI  Pt is still having left lower rib area pain in region of probable fx based on mechanism of injury, xray and physical exam. No longer has pain on deep inspiration of pain on bending and twisting. Pt is worried that if she tries to work won't be able to perform.   She has fmla forms for me to fill out.  Pt is using occasion al 1/2 tab hydrocodone at night to help reduce the pain so can sleep. During the day just using alleve.    Review of Systems  Constitutional:  Negative for chills, fatigue and fever.  Respiratory:  Negative for cough, chest tightness, shortness of breath and wheezing.   Cardiovascular:  Negative for chest pain and palpitations.  Gastrointestinal:  Negative for abdominal pain.  Musculoskeletal:  Negative for back pain.       Left rib pain  Skin:  Negative for rash.  Hematological:  Negative for adenopathy. Does not bruise/bleed easily.  Psychiatric/Behavioral:  Negative for behavioral problems and confusion.    Past Medical History:  Diagnosis Date   Generalized headaches    GERD (gastroesophageal reflux disease)    Hyperlipidemia    IBS (irritable bowel syndrome)    Osteoporosis    Vitamin D deficiency      Social History   Socioeconomic History   Marital status: Married    Spouse name: Not on file   Number of children: Not on file   Years of education: Not on file   Highest education level: Not on file  Occupational History   Not on file  Tobacco Use   Smoking status: Former    Packs/day: 0.50    Years: 28.00    Total pack years: 14.00    Types: Cigarettes    Quit date: 11/17/2010    Years since quitting: 11.3   Smokeless tobacco: Never  Vaping Use   Vaping Use: Never used  Substance and Sexual Activity   Alcohol use: Never   Drug use: Never   Sexual activity: Not Currently    Birth control/protection: None  Other Topics Concern   Not on  file  Social History Narrative   ** Merged History Encounter **       Social Determinants of Health   Financial Resource Strain: Not on file  Food Insecurity: Not on file  Transportation Needs: Not on file  Physical Activity: Not on file  Stress: Not on file  Social Connections: Not on file  Intimate Partner Violence: Not on file    Past Surgical History:  Procedure Laterality Date   CESAREAN SECTION     EYE SURGERY      No family history on file.  Allergies  Allergen Reactions   Ibuprofen Nausea And Vomiting and Other (See Comments)    GI upset GI upset If does not have coating    Current Outpatient Medications on File Prior to Visit  Medication Sig Dispense Refill   ASPIRIN 81 PO Take 1 tablet by mouth daily.     Cholecalciferol 50 MCG (2000 UT) TABS Take by mouth.     cyclobenzaprine (FLEXERIL) 5 MG tablet Take 1 tablet (5 mg total) by mouth at bedtime. 5 tablet 0   dicyclomine (BENTYL) 20 MG tablet TAKE 1-2 TABLETS BY MOUTH DAILY. 180 tablet 3   estradiol (ESTRACE) 0.1 MG/GM vaginal cream Apply 1 gram per vagina  every night for 2 weeks, then apply two to three times a week 30 g 12   estradiol (ESTRACE) 0.1 MG/GM vaginal cream Apply 1 gram per vagina every night for 2 weeks, then apply three times a week 30 g 12   gabapentin (NEURONTIN) 100 MG capsule Take 1 capsule (100 mg total) by mouth 3 (three) times daily. 90 capsule 3   metFORMIN (GLUCOPHAGE) 500 MG tablet Take 1 tablet (500 mg total) by mouth daily with breakfast. 90 tablet 3   omeprazole (PRILOSEC) 20 MG capsule TAKE 1 CAPSULE BY MOUTH EVERY DAY 90 capsule 3   Ospemifene 60 MG TABS Take by mouth.     risedronate (ACTONEL) 150 MG tablet TAKE 1 TABLET BY MOUTH EVERY 30 (THIRTY) DAYS.     rosuvastatin (CRESTOR) 20 MG tablet TAKE 1 TABLET BY MOUTH EVERY DAY 90 tablet 3   sodium polystyrene (KAYEXALATE) 15 GM/60ML suspension 60 ml po for 4 days. 240 mL 0   triamcinolone cream (KENALOG) 0.1 % Apply 1 Application  topically 2 (two) times daily. 30 g 0   No current facility-administered medications on file prior to visit.    BP (!) 136/90   Pulse 79   Temp 98.2 F (36.8 C)   Resp 18   Ht 5' 1"  (1.549 m)   Wt 136 lb (61.7 kg)   SpO2 96%   BMI 25.70 kg/m        Objective:   Physical Exam  General- No acute distress. Pleasant patient. Neck- Full range of motion, no jvd Lungs- Clear, even and unlabored. Heart- regular rate and rhythm. Neurologic- CNII- XII grossly intact.  Anterior chest- left rib mild to moderate pain over 5th rib in area in question on xray.      Assessment & Plan:   Left rib pain on palpation in region of are described by radiologist as    1. Equivocal fracture deformity involving the anterior aspect of the left fifth rib is blurred by motion artifact. Correlate for any focal tenderness in this area.     At this point due think had fracture since still in pain and feeling like not able to work.   Recommend continue current regimen of alleve and norco.   Filled out fmla form today.   Follow up 04-06-2021.  Mackie Pai, PA-C

## 2022-03-16 NOTE — Patient Instructions (Addendum)
Left rib pain on palpation in region of are described by radiologist as    1. Equivocal fracture deformity involving the anterior aspect of the left fifth rib is blurred by motion artifact. Correlate for any focal tenderness in this area.     At this point due think had fracture since still in pain and feeling like not able to work.   Recommend continue current regimen of alleve and norco.   Filled out fmla form today.   Follow up 04-06-2021.

## 2022-03-26 ENCOUNTER — Institutional Professional Consult (permissible substitution): Payer: 59 | Admitting: Neurology

## 2022-03-29 ENCOUNTER — Encounter: Payer: Self-pay | Admitting: Neurology

## 2022-04-06 ENCOUNTER — Ambulatory Visit: Payer: 59 | Admitting: Medical

## 2022-04-06 VITALS — BP 120/82 | HR 86 | Temp 98.5°F | Resp 18 | Ht 61.0 in | Wt 136.0 lb

## 2022-04-06 DIAGNOSIS — R0781 Pleurodynia: Secondary | ICD-10-CM

## 2022-04-06 NOTE — Progress Notes (Signed)
Subjective:    Patient ID: Jacqueline Vasquez, female    DOB: 1960/10/26, 62 y.o.   MRN: [id number]  HPI  Pt in for follow up. Pt states she feel a lot better. She states no longer has pain in her ribs on deep inspiration. She can now mover left arm and twist thorax with no pain. Only states very rarely now has slight one second or so pinching sensation.  Pt now about 5 weeks post injury of rib.  She has been off from work.  Pt had planned to return to work on Monday. Wanted to see her back today since her return to work would be without restrictions. She feels like ready to go back.  Review of Systems  Constitutional:  Negative for chills, fatigue and fever.  Respiratory:  Negative for cough, chest tightness, shortness of breath and wheezing.   Cardiovascular:  Negative for chest pain and palpitations.  Gastrointestinal:  Negative for abdominal pain.  Musculoskeletal:        Left rib pain basically resolved.  Skin:  Negative for rash.    Past Medical History:  Diagnosis Date   Generalized headaches    GERD (gastroesophageal reflux disease)    Hyperlipidemia    IBS (irritable bowel syndrome)    Osteoporosis    Vitamin D deficiency      Social History   Socioeconomic History   Marital status: Married    Spouse name: Not on file   Number of children: Not on file   Years of education: Not on file   Highest education level: Not on file  Occupational History   Not on file  Tobacco Use   Smoking status: Former    Packs/day: 0.50    Years: 28.00    Total pack years: 14.00    Types: Cigarettes    Quit date: 11/17/2010    Years since quitting: 11.3   Smokeless tobacco: Never  Vaping Use   Vaping Use: Never used  Substance and Sexual Activity   Alcohol use: Never   Drug use: Never   Sexual activity: Not Currently    Birth control/protection: None  Other Topics Concern   Not on file  Social History Narrative   ** Merged History Encounter **       Social Determinants of  Health   Financial Resource Strain: Not on file  Food Insecurity: Not on file  Transportation Needs: Not on file  Physical Activity: Not on file  Stress: Not on file  Social Connections: Not on file  Intimate Partner Violence: Not on file    Past Surgical History:  Procedure Laterality Date   CESAREAN SECTION     EYE SURGERY      No family history on file.  Allergies  Allergen Reactions   Ibuprofen Nausea And Vomiting and Other (See Comments)    GI upset GI upset If does not have coating    Current Outpatient Medications on File Prior to Visit  Medication Sig Dispense Refill   ASPIRIN 81 PO Take 1 tablet by mouth daily.     Cholecalciferol 50 MCG (2000 UT) TABS Take by mouth.     cyclobenzaprine (FLEXERIL) 5 MG tablet Take 1 tablet (5 mg total) by mouth at bedtime. 5 tablet 0   dicyclomine (BENTYL) 20 MG tablet TAKE 1-2 TABLETS BY MOUTH DAILY. 180 tablet 3   estradiol (ESTRACE) 0.1 MG/GM vaginal cream Apply 1 gram per vagina every night for 2 weeks, then apply two to three times  a week 30 g 12   estradiol (ESTRACE) 0.1 MG/GM vaginal cream Apply 1 gram per vagina every night for 2 weeks, then apply three times a week 30 g 12   gabapentin (NEURONTIN) 100 MG capsule Take 1 capsule (100 mg total) by mouth 3 (three) times daily. 90 capsule 3   metFORMIN (GLUCOPHAGE) 500 MG tablet Take 1 tablet (500 mg total) by mouth daily with breakfast. 90 tablet 3   omeprazole (PRILOSEC) 20 MG capsule TAKE 1 CAPSULE BY MOUTH EVERY DAY 90 capsule 3   Ospemifene 60 MG TABS Take by mouth.     risedronate (ACTONEL) 150 MG tablet TAKE 1 TABLET BY MOUTH EVERY 30 (THIRTY) DAYS.     rosuvastatin (CRESTOR) 20 MG tablet TAKE 1 TABLET BY MOUTH EVERY DAY 90 tablet 3   sodium polystyrene (KAYEXALATE) 15 GM/60ML suspension 60 ml po for 4 days. 240 mL 0   triamcinolone cream (KENALOG) 0.1 % Apply 1 Application topically 2 (two) times daily. 30 g 0   No current facility-administered medications on file  prior to visit.    BP 120/82   Pulse 86   Temp 98.5 F (36.9 C)   Resp 18   Ht 5\' 1"  (1.549 m)   Wt 136 lb (61.7 kg)   SpO2 97%   BMI 25.70 kg/m        Objective:   Physical Exam  General- No acute distress. Pleasant patient. Neck- Full range of motion, no jvd Lungs- Clear, even and unlabored. Heart- regular rate and rhythm. Neurologic- CNII- XII grossly intact.  Anterior thorax- left ribs today not tender to palpation.     Assessment & Plan:   Patient Instructions  Left rib pain now almost completley resolves except for second or so occasional twinge pain. Do think you are ready to return to work with no restrictions.  Will write a note today stating return to work with no restrictions.  Follow up 1-2 months for wellness exam or sooner if needed   Mackie Pai, PA-C

## 2022-04-06 NOTE — Patient Instructions (Addendum)
Left rib pain now almost completley resolves except for second or so occasional twinge pain. Do think you are ready to return to work with no restrictions.  Will write a note today stating return to work with no restrictions.  Follow up 1-2 months for wellness exam or sooner if needed

## 2022-05-02 ENCOUNTER — Ambulatory Visit (INDEPENDENT_AMBULATORY_CARE_PROVIDER_SITE_OTHER): Payer: 59 | Admitting: Medical

## 2022-05-02 ENCOUNTER — Encounter: Payer: Self-pay | Admitting: Medical

## 2022-05-02 VITALS — BP 124/84 | HR 77 | Temp 98.0°F | Resp 18 | Ht 61.0 in | Wt 130.0 lb

## 2022-05-02 DIAGNOSIS — R1084 Generalized abdominal pain: Secondary | ICD-10-CM

## 2022-05-02 DIAGNOSIS — M810 Age-related osteoporosis without current pathological fracture: Secondary | ICD-10-CM

## 2022-05-02 DIAGNOSIS — Z0000 Encounter for general adult medical examination without abnormal findings: Secondary | ICD-10-CM | POA: Diagnosis not present

## 2022-05-02 DIAGNOSIS — N941 Unspecified dyspareunia: Secondary | ICD-10-CM

## 2022-05-02 DIAGNOSIS — K589 Irritable bowel syndrome without diarrhea: Secondary | ICD-10-CM | POA: Diagnosis not present

## 2022-05-02 DIAGNOSIS — E119 Type 2 diabetes mellitus without complications: Secondary | ICD-10-CM

## 2022-05-02 DIAGNOSIS — N898 Other specified noninflammatory disorders of vagina: Secondary | ICD-10-CM

## 2022-05-02 DIAGNOSIS — Z1231 Encounter for screening mammogram for malignant neoplasm of breast: Secondary | ICD-10-CM

## 2022-05-02 LAB — CBC WITH DIFFERENTIAL/PLATELET
Basophils Absolute: 0.1 10*3/uL (ref 0.0–0.1)
Basophils Relative: 0.8 % (ref 0.0–3.0)
Eosinophils Absolute: 0.2 10*3/uL (ref 0.0–0.7)
Eosinophils Relative: 2.7 % (ref 0.0–5.0)
HCT: 42.1 % (ref 36.0–46.0)
Hemoglobin: 14.1 g/dL (ref 12.0–15.0)
Lymphocytes Relative: 41.2 % (ref 12.0–46.0)
Lymphs Abs: 2.6 10*3/uL (ref 0.7–4.0)
MCHC: 33.5 g/dL (ref 30.0–36.0)
MCV: 92.7 fl (ref 78.0–100.0)
Monocytes Absolute: 0.3 10*3/uL (ref 0.1–1.0)
Monocytes Relative: 4.9 % (ref 3.0–12.0)
Neutro Abs: 3.2 10*3/uL (ref 1.4–7.7)
Neutrophils Relative %: 50.4 % (ref 43.0–77.0)
Platelets: 258 10*3/uL (ref 150.0–400.0)
RBC: 4.54 Mil/uL (ref 3.87–5.11)
RDW: 12.5 % (ref 11.5–15.5)
WBC: 6.3 10*3/uL (ref 4.0–10.5)

## 2022-05-02 LAB — COMPREHENSIVE METABOLIC PANEL
ALT: 49 U/L — ABNORMAL HIGH (ref 0–35)
AST: 36 U/L (ref 0–37)
Albumin: 4.7 g/dL (ref 3.5–5.2)
Alkaline Phosphatase: 58 U/L (ref 39–117)
BUN: 11 mg/dL (ref 6–23)
CO2: 28 mEq/L (ref 19–32)
Calcium: 9.7 mg/dL (ref 8.4–10.5)
Chloride: 102 mEq/L (ref 96–112)
Creatinine, Ser: 0.49 mg/dL (ref 0.40–1.20)
GFR: 100.06 mL/min (ref >60.00)
Glucose, Bld: 112 mg/dL — ABNORMAL HIGH (ref 70–99)
Potassium: 4.2 mEq/L (ref 3.5–5.1)
Sodium: 137 mEq/L (ref 135–145)
Total Bilirubin: 0.6 mg/dL (ref 0.2–1.2)
Total Protein: 7.6 g/dL (ref 6.0–8.3)

## 2022-05-02 LAB — LIPID PANEL
Cholesterol: 190 mg/dL (ref 0–200)
HDL: 46 mg/dL (ref >39.00)
NonHDL: 143.94
Total CHOL/HDL Ratio: 4
Triglycerides: 279 mg/dL — ABNORMAL HIGH (ref 0.0–149.0)
VLDL: 55.8 mg/dL — ABNORMAL HIGH (ref 0.0–40.0)

## 2022-05-02 LAB — LDL CHOLESTEROL, DIRECT: Direct LDL: 92 mg/dL

## 2022-05-02 LAB — LIPASE: Lipase: 15 U/L (ref 11.0–59.0)

## 2022-05-02 MED ORDER — ESTRADIOL 0.1 MG/GM VA CREA: TOPICAL_CREAM | VAGINAL | 0 refills | Status: DC

## 2022-05-02 MED ORDER — ESTRADIOL 0.1 MG/GM VA CREA: TOPICAL_CREAM | VAGINAL | 0 refills | Status: AC

## 2022-05-02 NOTE — Progress Notes (Signed)
Subjective:    Patient ID: Jacqueline Vasquez, female    DOB: 1960/11/10, 62 y.o.   MRN: [id number]  HPI  Pt in for wellness and for various concerns.  Currently employed. Admits not healthiest diet.  Minimal exercise.. Non smoker. No alcohol.   On review needs mammogram. Appears up to date on pap. May need updated colonoscopy.  Offered shingrix vaccine.   Pt has some abdomen bloating for one year. Pt associated this with bentyl and recent worse gi symptoms with metformin. She notes when she gets stressed stomach hurts.  Describes softer stool. Not diarrhea or constipation. Daily bm 1-2 times.  Pt states random abd pain both after eating at times and without eating. Yesterday at meal and felt very bloating. States most of time feels severly bloated after eating.    A1c was 6.6. I had started metformin due to mild high A1c.She thinks this upset stomach more.   Pt states wants refill of estradiol.     Review of Systems  Constitutional:  Negative for chills, fatigue and fever.  HENT:  Negative for congestion, drooling and ear discharge.   Respiratory:  Negative for cough, chest tightness, shortness of breath and wheezing.   Cardiovascular:  Negative for chest pain.  Gastrointestinal:  Positive for abdominal pain. Negative for abdominal distention, blood in stool, constipation, diarrhea, nausea and vomiting.  Genitourinary:  Negative for dysuria, flank pain, frequency, hematuria and urgency.       Vaginal dryness.  Neurological:  Negative for dizziness, seizures and headaches.  Hematological:  Negative for adenopathy. Does not bruise/bleed easily.  Psychiatric/Behavioral:  Negative for behavioral problems. The patient is not nervous/anxious and is not hyperactive.        At times anxious at work.    Past Medical History:  Diagnosis Date   Generalized headaches    GERD (gastroesophageal reflux disease)    Hyperlipidemia    IBS (irritable bowel syndrome)    Osteoporosis     Vitamin D deficiency      Social History   Socioeconomic History   Marital status: Married    Spouse name: Not on file   Number of children: Not on file   Years of education: Not on file   Highest education level: Not on file  Occupational History   Not on file  Tobacco Use   Smoking status: Former    Packs/day: 0.50    Years: 28.00    Total pack years: 14.00    Types: Cigarettes    Quit date: 11/17/2010    Years since quitting: 11.4   Smokeless tobacco: Never  Vaping Use   Vaping Use: Never used  Substance and Sexual Activity   Alcohol use: Never   Drug use: Never   Sexual activity: Not Currently    Birth control/protection: None  Other Topics Concern   Not on file  Social History Narrative   ** Merged History Encounter **       Social Determinants of Health   Financial Resource Strain: Not on file  Food Insecurity: Not on file  Transportation Needs: Not on file  Physical Activity: Not on file  Stress: Not on file  Social Connections: Not on file  Intimate Partner Violence: Not on file    Past Surgical History:  Procedure Laterality Date   CESAREAN SECTION     EYE SURGERY      History reviewed. No pertinent family history.  Allergies  Allergen Reactions   Ibuprofen Nausea And Vomiting and Other (  See Comments)    GI upset GI upset If does not have coating    Current Outpatient Medications on File Prior to Visit  Medication Sig Dispense Refill   ASPIRIN 81 PO Take 1 tablet by mouth daily.     Cholecalciferol 50 MCG (2000 UT) TABS Take by mouth.     cyclobenzaprine (FLEXERIL) 5 MG tablet Take 1 tablet (5 mg total) by mouth at bedtime. 5 tablet 0   dicyclomine (BENTYL) 20 MG tablet TAKE 1-2 TABLETS BY MOUTH DAILY. 180 tablet 3   estradiol (ESTRACE) 0.1 MG/GM vaginal cream Apply 1 gram per vagina every night for 2 weeks, then apply three times a week 30 g 12   gabapentin (NEURONTIN) 100 MG capsule Take 1 capsule (100 mg total) by mouth 3 (three) times  daily. 90 capsule 3   metFORMIN (GLUCOPHAGE) 500 MG tablet Take 1 tablet (500 mg total) by mouth daily with breakfast. 90 tablet 3   omeprazole (PRILOSEC) 20 MG capsule TAKE 1 CAPSULE BY MOUTH EVERY DAY 90 capsule 3   Ospemifene 60 MG TABS Take by mouth.     risedronate (ACTONEL) 150 MG tablet TAKE 1 TABLET BY MOUTH EVERY 30 (THIRTY) DAYS.     rosuvastatin (CRESTOR) 20 MG tablet TAKE 1 TABLET BY MOUTH EVERY DAY 90 tablet 3   sodium polystyrene (KAYEXALATE) 15 GM/60ML suspension 60 ml po for 4 days. 240 mL 0   triamcinolone cream (KENALOG) 0.1 % Apply 1 Application topically 2 (two) times daily. 30 g 0   No current facility-administered medications on file prior to visit.    BP 124/84   Pulse 77   Temp 98 F (36.7 C)   Resp 18   Ht 5' 1"$  (1.549 m)   Wt 130 lb (59 kg)   SpO2 100%   BMI 24.56 kg/m        Objective:   Physical Exam General Mental Status- Alert. General Appearance- Not in acute distress.   Skin General: Color- Normal Color. Moisture- Normal Moisture.  Neck Carotid Arteries- Normal color. Moisture- Normal Moisture. No carotid bruits. No JVD.  Chest and Lung Exam Auscultation: Breath Sounds:-Normal.  Cardiovascular Auscultation:Rythm- Regular. Murmurs & Other Heart Sounds:Auscultation of the heart reveals- No Murmurs.  Abdomen Inspection:-Inspeection Normal. Palpation/Percussion:Note:No mass. Palpation and Percussion of the abdomen reveal- Non Tender, Non Distended + BS, no rebound or guarding.    Neurologic Cranial Nerve exam:- CN III-XII intact(No nystagmus), symmetric smile. Drift Test:- No drift. Romberg Exam:- Negative.  Heal to Toe Gait exam:-Normal. Finger to Nose:- Normal/Intact Strength:- 5/5 equal and symmetric strength both upper and lower extremities.        Assessment & Plan:   Patient Instructions  Wellness today.   Ordered mammogram.   Offered shingrix but delcined.  Lipid panel ordered as part of wellness  exam.  Recommend exercise and healthy diet.  Generalized abdominal pain(suspect   Irritable bowel syndrome.) DC bentyl for short term. Try metamucil 1 tablespoon in 8 oz water 3 times daily. Probiotics otc. - Ambulatory referral to Gastroenterology - CBC w/Diff - Comp Met (CMET) - Lipase   Osteoporosis, unspecified osteoporosis type, unspecified pathological fracture presence After review the study refill actonel or rx other med. - DG Bone Density; Future  Vaginal dryness. Refill estrace but do want you to continue with gyn  - estradiol (ESTRACE) 0.1 MG/GM vaginal cream; Apply 1 gram per vagina every night for 2 weeks, then apply two to three times a week  Dispense: 30 g;  Refill: 0 - Ambulatory referral to Gynecology   Diabetes- dc metformin due to side effects. Presetly refer for diabetic education and exercise daiy.  Follow up  3 months or sooner if needed.   Mackie Pai, [place]   [id number] charge in addition to wellness exam. Various listed concerns. Pt daughter present today and translated for pt.

## 2022-05-02 NOTE — Patient Instructions (Addendum)
Wellness today.   Ordered mammogram.   Offered shingrix but delcined.  Lipid panel ordered as part of wellness exam.  Recommend exercise and healthy diet.  Generalized abdominal pain(suspect   Irritable bowel syndrome.) DC bentyl for short term. Try metamucil 1 tablespoon in 8 oz water 3 times daily. Probiotics otc. - Ambulatory referral to Gastroenterology - CBC w/Diff - Comp Met (CMET) - Lipase   Osteoporosis, unspecified osteoporosis type, unspecified pathological fracture presence After review the study refill actonel or rx other med. - DG Bone Density; Future  Vaginal dryness. Refill estrace but do want you to continue with gyn  - estradiol (ESTRACE) 0.1 MG/GM vaginal cream; Apply 1 gram per vagina every night for 2 weeks, then apply two to three times a week  Dispense: 30 g; Refill: 0 - Ambulatory referral to Gynecology   Diabetes- dc metformin due to side effects. Presetly refer for diabetic education and exercise daiy.  Follow up  3 months or sooner if needed.

## 2022-05-11 ENCOUNTER — Telehealth (HOSPITAL_BASED_OUTPATIENT_CLINIC_OR_DEPARTMENT_OTHER): Payer: Self-pay

## 2022-07-23 ENCOUNTER — Ambulatory Visit: Payer: 59 | Admitting: Neurology

## 2022-10-02 ENCOUNTER — Ambulatory Visit: Payer: 59 | Admitting: Neurology

## 2022-10-22 ENCOUNTER — Ambulatory Visit: Payer: 59 | Admitting: Neurology

## 2023-01-10 ENCOUNTER — Ambulatory Visit: Payer: 59 | Admitting: Neurology

## 2023-09-10 IMAGING — CT CT HEAD W/O CM
4 series · 16 of 47 positions shown, 18 images · non-contrast
Comparison: None Available.

CLINICAL DATA: Headaches and dizziness.



[Series 2: head wo · axial · 0.37mm/px · z∈[-170,-55]mm · 7 of 31 slices shown, 9 images]
[im 4/31  brain]
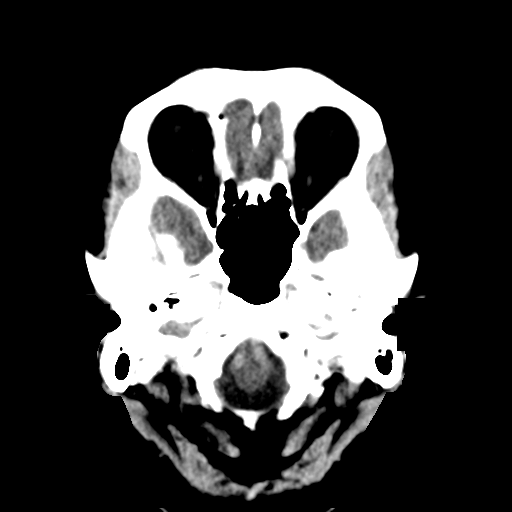
[im 4/31  bone]
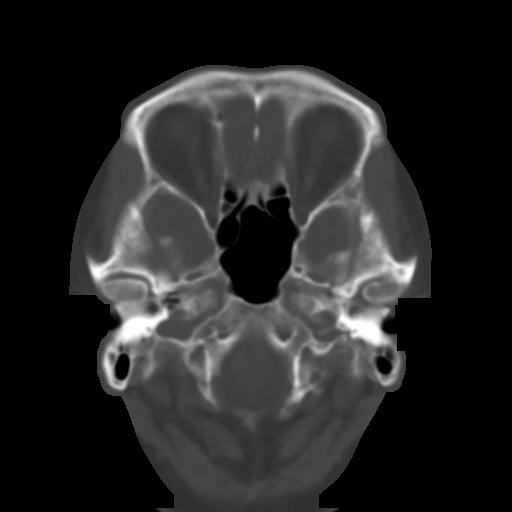
[im 8/31  brain]
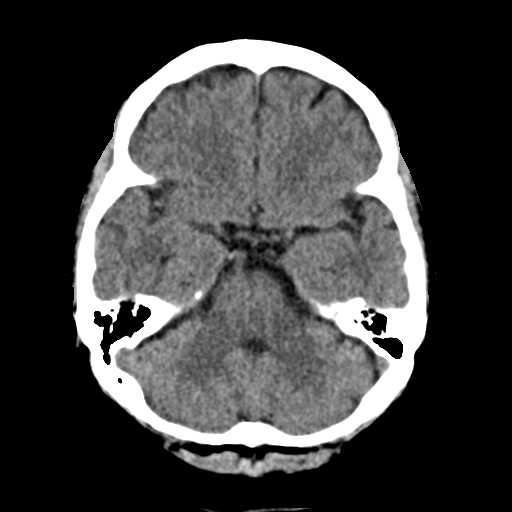
[im 12/31  brain]
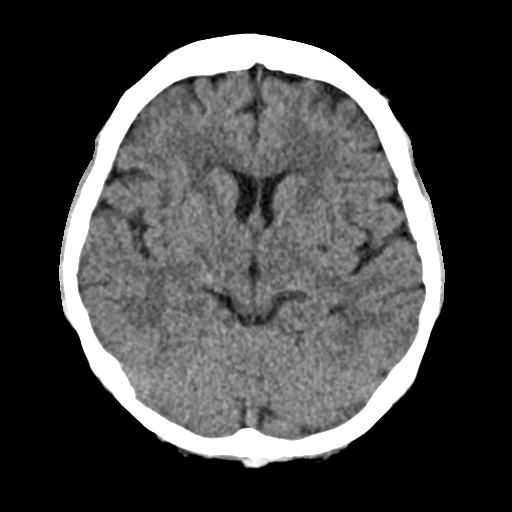
[im 16/31  brain]
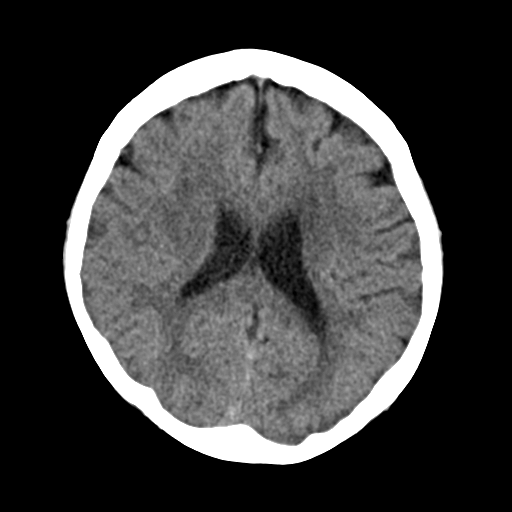
[im 19/31  brain]
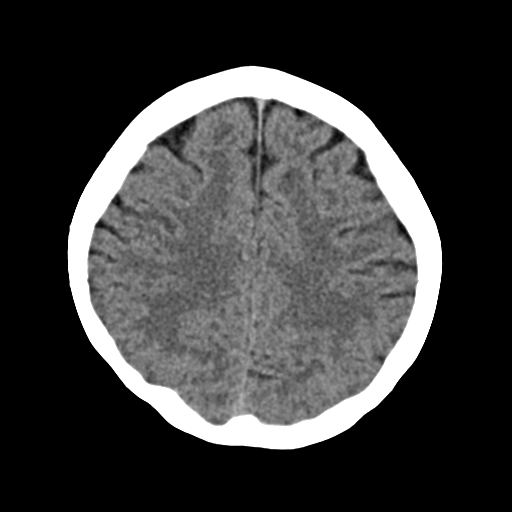
[im 19/31  bone]
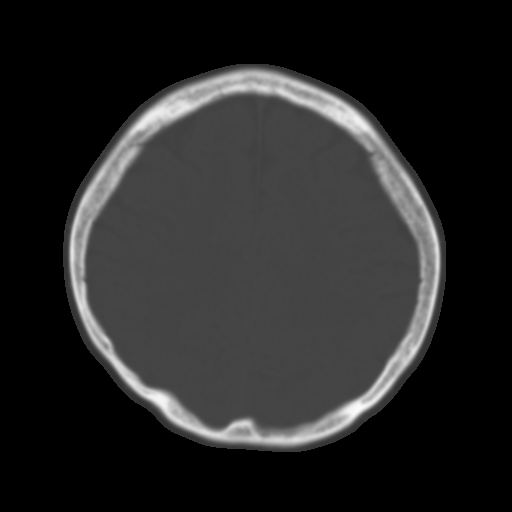
[im 23/31  brain]
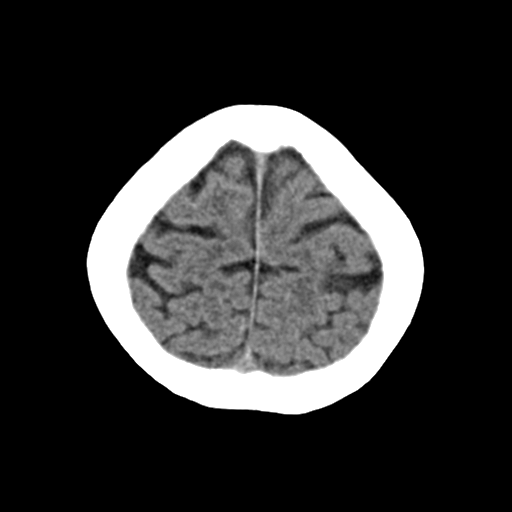
[im 27/31  brain]
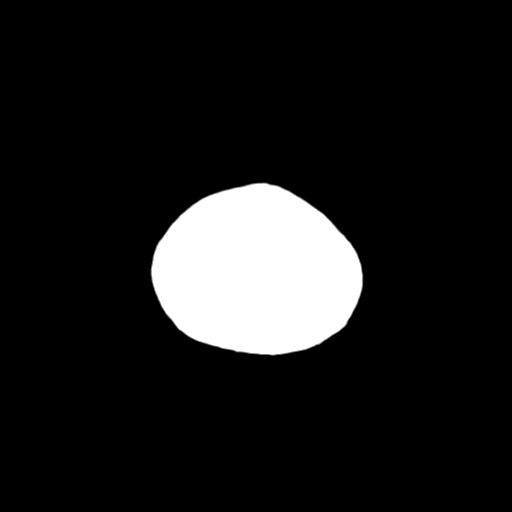

[Series 3: head bone · axial · 0.37mm/px · z∈[-171,-141]mm · 3 of 77 slices shown]
[im 8/77  bone]
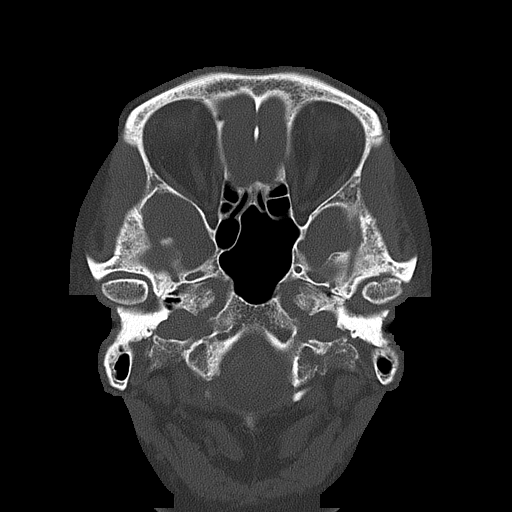
[im 16/77  bone]
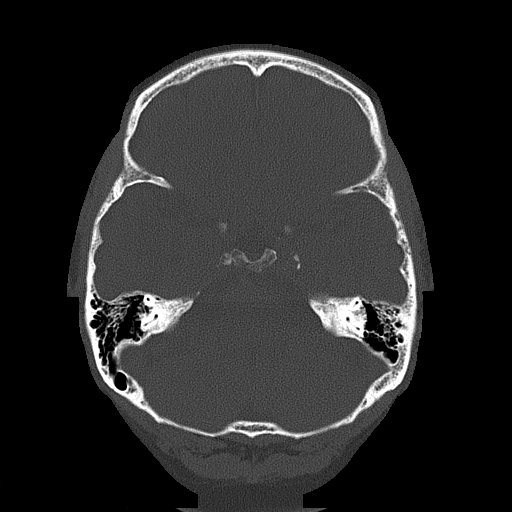
[im 23/77  bone]
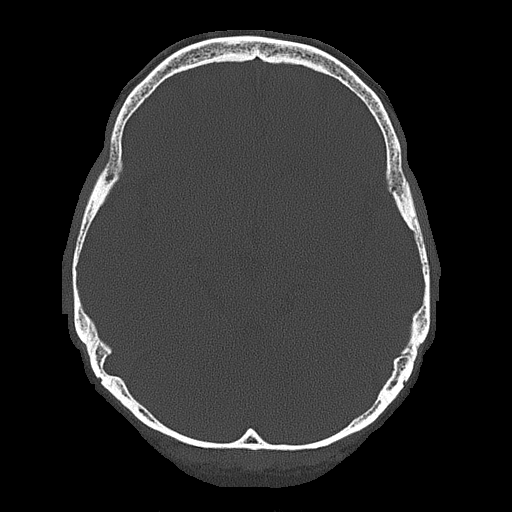

[Series 4: coronal soft · coronal · 0.31mm/px · 3 of 57 slices shown]
[im 19/57  brain]
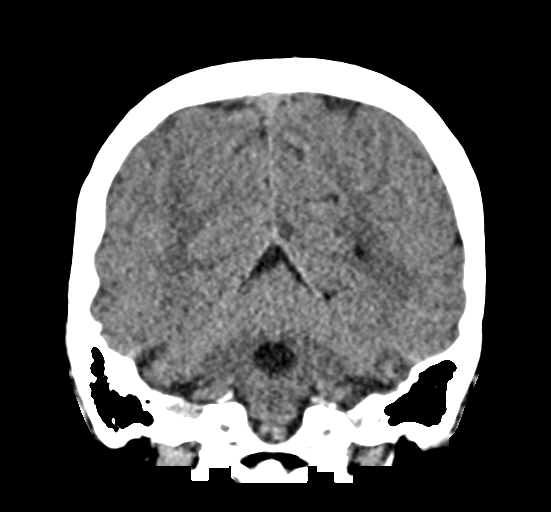
[im 25/57  brain]
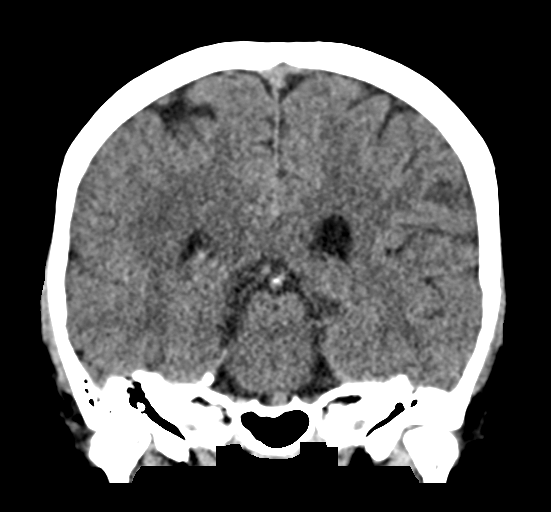
[im 32/57  brain]
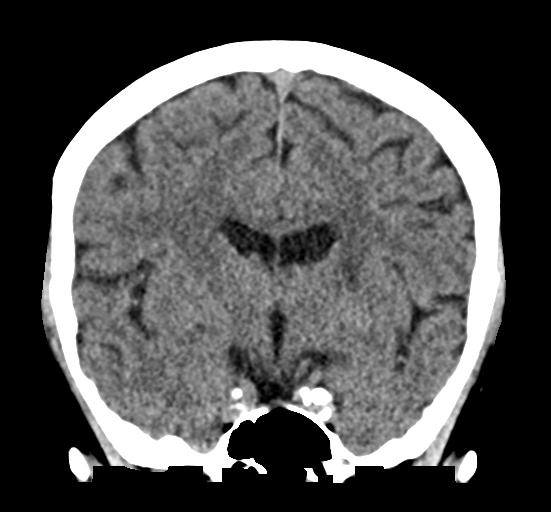

[Series 5: sag soft · sagittal · 0.31mm/px · 3 of 53 slices shown]
[im 18/53  brain]
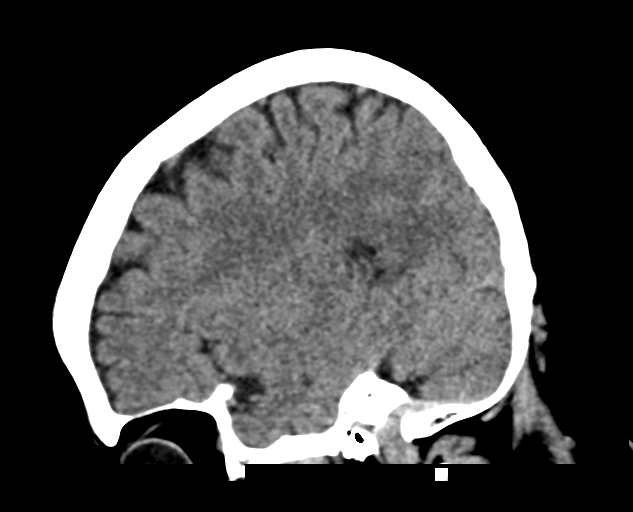
[im 27/53  brain]
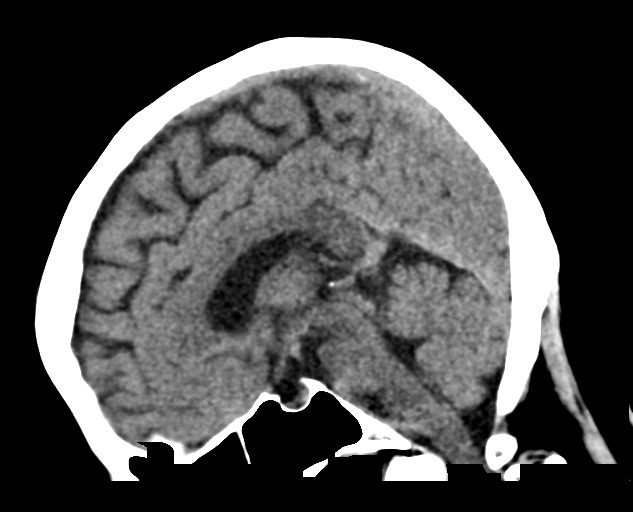
[im 35/53  brain]
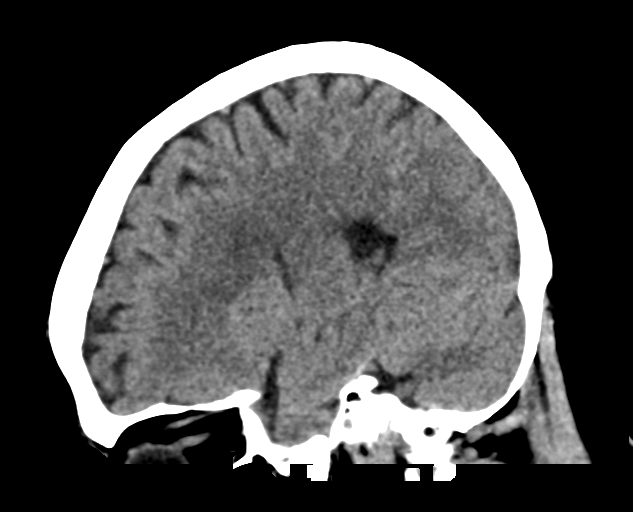

[16 of 47 positions shown; findings below may reference images not displayed]

FINDINGS: Brain: There is no evidence of an acute infarct, intracranial
hemorrhage, mass, midline shift, or extra-axial fluid collection.
The ventricles and sulci are normal. A chronic lacunar infarct is
noted in the left basal ganglia. Hypodensities in the cerebral white
matter bilaterally are nonspecific but compatible with mild chronic
small vessel ischemic disease.

Vascular: Calcified atherosclerosis at the skull base. No hyperdense
vessel.

Skull: No fracture or suspicious osseous lesion.

Sinuses/Orbits: Visualized paranasal sinuses and mastoid air cells
are clear. Visualized orbits are unremarkable.

Other: None.
IMPRESSION: 1. No evidence of acute intracranial abnormality.
2. Mild chronic small vessel ischemic disease. Chronic left basal
ganglia infarct.
